# Patient Record
Sex: Female | Born: 1960 | Race: White | Hispanic: Yes | Marital: Single | Smoking: Former smoker
Health system: Southern US, Community
[De-identification: ages and names within clinical notes are randomized; demographics above are authoritative.]

## PROBLEM LIST (undated history)

## (undated) DIAGNOSIS — J3089 Other allergic rhinitis: Secondary | ICD-10-CM

## (undated) DIAGNOSIS — R51 Headache: Secondary | ICD-10-CM

## (undated) DIAGNOSIS — R519 Headache, unspecified: Secondary | ICD-10-CM

## (undated) DIAGNOSIS — E7800 Pure hypercholesterolemia, unspecified: Secondary | ICD-10-CM

## (undated) DIAGNOSIS — R42 Dizziness and giddiness: Secondary | ICD-10-CM

## (undated) DIAGNOSIS — Z9109 Other allergy status, other than to drugs and biological substances: Secondary | ICD-10-CM

## (undated) HISTORY — DX: Headache: R51

## (undated) HISTORY — DX: Other allergic rhinitis: J30.89

## (undated) HISTORY — DX: Headache, unspecified: R51.9

## (undated) HISTORY — DX: Pure hypercholesterolemia, unspecified: E78.00

## (undated) HISTORY — PX: TUMOR REMOVAL: SHX12

## (undated) HISTORY — PX: VAGINAL HYSTERECTOMY: SHX2639

## (undated) HISTORY — DX: Other allergy status, other than to drugs and biological substances: Z91.09

## (undated) HISTORY — DX: Dizziness and giddiness: R42

---

## 2007-05-21 ENCOUNTER — Ambulatory Visit (HOSPITAL_COMMUNITY): Admission: RE | Admit: 2007-05-21 | Discharge: 2007-05-21 | Payer: Self-pay | Admitting: Family Medicine

## 2010-09-18 ENCOUNTER — Encounter: Payer: Self-pay | Admitting: Family Medicine

## 2010-09-19 ENCOUNTER — Encounter: Payer: Self-pay | Admitting: Family Medicine

## 2013-08-19 DIAGNOSIS — R52 Pain, unspecified: Secondary | ICD-10-CM | POA: Diagnosis not present

## 2013-08-19 DIAGNOSIS — R0989 Other specified symptoms and signs involving the circulatory and respiratory systems: Secondary | ICD-10-CM | POA: Diagnosis not present

## 2013-08-19 DIAGNOSIS — J069 Acute upper respiratory infection, unspecified: Secondary | ICD-10-CM | POA: Diagnosis not present

## 2013-12-02 ENCOUNTER — Other Ambulatory Visit (HOSPITAL_COMMUNITY): Payer: Self-pay | Admitting: General Surgery

## 2013-12-02 DIAGNOSIS — E7800 Pure hypercholesterolemia, unspecified: Secondary | ICD-10-CM | POA: Diagnosis not present

## 2013-12-02 DIAGNOSIS — Z1231 Encounter for screening mammogram for malignant neoplasm of breast: Secondary | ICD-10-CM

## 2013-12-02 DIAGNOSIS — M2550 Pain in unspecified joint: Secondary | ICD-10-CM | POA: Diagnosis not present

## 2013-12-03 DIAGNOSIS — M2550 Pain in unspecified joint: Secondary | ICD-10-CM | POA: Diagnosis not present

## 2013-12-03 DIAGNOSIS — E7800 Pure hypercholesterolemia, unspecified: Secondary | ICD-10-CM | POA: Diagnosis not present

## 2013-12-05 ENCOUNTER — Ambulatory Visit (HOSPITAL_COMMUNITY)
Admission: RE | Admit: 2013-12-05 | Discharge: 2013-12-05 | Disposition: A | Discharge status: Home / Self Care | Payer: Medicare Other | Source: Ambulatory Visit | Attending: General Surgery | Admitting: General Surgery

## 2013-12-05 DIAGNOSIS — Z1231 Encounter for screening mammogram for malignant neoplasm of breast: Secondary | ICD-10-CM | POA: Diagnosis not present

## 2013-12-15 DIAGNOSIS — E7800 Pure hypercholesterolemia, unspecified: Secondary | ICD-10-CM | POA: Diagnosis not present

## 2013-12-15 DIAGNOSIS — I10 Essential (primary) hypertension: Secondary | ICD-10-CM | POA: Diagnosis not present

## 2014-01-14 ENCOUNTER — Other Ambulatory Visit (HOSPITAL_COMMUNITY): Payer: Self-pay | Admitting: General Surgery

## 2014-01-14 DIAGNOSIS — R109 Unspecified abdominal pain: Secondary | ICD-10-CM | POA: Diagnosis not present

## 2014-01-14 DIAGNOSIS — R1011 Right upper quadrant pain: Secondary | ICD-10-CM | POA: Diagnosis not present

## 2014-01-14 DIAGNOSIS — I10 Essential (primary) hypertension: Secondary | ICD-10-CM | POA: Diagnosis not present

## 2014-01-14 DIAGNOSIS — R110 Nausea: Secondary | ICD-10-CM

## 2014-01-15 ENCOUNTER — Ambulatory Visit (HOSPITAL_COMMUNITY)
Admission: RE | Admit: 2014-01-15 | Discharge: 2014-01-15 | Disposition: A | Discharge status: Home / Self Care | Payer: Medicare Other | Source: Ambulatory Visit | Attending: General Surgery | Admitting: General Surgery

## 2014-01-15 DIAGNOSIS — R1011 Right upper quadrant pain: Secondary | ICD-10-CM

## 2014-01-15 DIAGNOSIS — R110 Nausea: Secondary | ICD-10-CM

## 2014-01-15 DIAGNOSIS — I10 Essential (primary) hypertension: Secondary | ICD-10-CM | POA: Diagnosis not present

## 2014-03-17 DIAGNOSIS — F0789 Other personality and behavioral disorders due to known physiological condition: Secondary | ICD-10-CM | POA: Diagnosis not present

## 2014-03-17 DIAGNOSIS — R1011 Right upper quadrant pain: Secondary | ICD-10-CM | POA: Diagnosis not present

## 2014-03-17 DIAGNOSIS — R109 Unspecified abdominal pain: Secondary | ICD-10-CM | POA: Diagnosis not present

## 2014-03-17 DIAGNOSIS — I10 Essential (primary) hypertension: Secondary | ICD-10-CM | POA: Diagnosis not present

## 2014-04-03 DIAGNOSIS — I10 Essential (primary) hypertension: Secondary | ICD-10-CM | POA: Diagnosis not present

## 2014-04-03 DIAGNOSIS — R7309 Other abnormal glucose: Secondary | ICD-10-CM | POA: Diagnosis not present

## 2014-04-03 DIAGNOSIS — R413 Other amnesia: Secondary | ICD-10-CM | POA: Diagnosis not present

## 2014-04-03 DIAGNOSIS — E7800 Pure hypercholesterolemia, unspecified: Secondary | ICD-10-CM | POA: Diagnosis not present

## 2014-06-16 DIAGNOSIS — I10 Essential (primary) hypertension: Secondary | ICD-10-CM | POA: Diagnosis not present

## 2014-06-16 DIAGNOSIS — K219 Gastro-esophageal reflux disease without esophagitis: Secondary | ICD-10-CM | POA: Diagnosis not present

## 2014-09-02 DIAGNOSIS — I10 Essential (primary) hypertension: Secondary | ICD-10-CM | POA: Diagnosis not present

## 2014-09-02 DIAGNOSIS — K2970 Gastritis, unspecified, without bleeding: Secondary | ICD-10-CM | POA: Diagnosis not present

## 2015-08-16 DIAGNOSIS — Z0000 Encounter for general adult medical examination without abnormal findings: Secondary | ICD-10-CM | POA: Diagnosis not present

## 2015-09-05 ENCOUNTER — Telehealth: Payer: Self-pay | Admitting: *Deleted

## 2015-09-05 NOTE — Telephone Encounter (Signed)
Called and spoke to [name] through interpreter line. Gave number [id number]. He called pt.  LVM for pt. Advised office closed tomorrow d/t inclement weather and we will have to r/s appt tomorrow. Advised to call on Tuesday to r/s appt.

## 2015-09-06 ENCOUNTER — Ambulatory Visit: Payer: Medicare Other | Admitting: Neurology

## 2015-09-13 DIAGNOSIS — G4719 Other hypersomnia: Secondary | ICD-10-CM | POA: Diagnosis not present

## 2015-09-13 DIAGNOSIS — G4733 Obstructive sleep apnea (adult) (pediatric): Secondary | ICD-10-CM | POA: Diagnosis not present

## 2015-09-27 DIAGNOSIS — G4719 Other hypersomnia: Secondary | ICD-10-CM | POA: Diagnosis not present

## 2015-09-27 DIAGNOSIS — G4733 Obstructive sleep apnea (adult) (pediatric): Secondary | ICD-10-CM | POA: Diagnosis not present

## 2015-10-11 DIAGNOSIS — R411 Anterograde amnesia: Secondary | ICD-10-CM | POA: Diagnosis not present

## 2015-10-11 DIAGNOSIS — I10 Essential (primary) hypertension: Secondary | ICD-10-CM | POA: Diagnosis not present

## 2015-10-11 DIAGNOSIS — E7800 Pure hypercholesterolemia, unspecified: Secondary | ICD-10-CM | POA: Diagnosis not present

## 2015-10-24 ENCOUNTER — Telehealth: Payer: Self-pay | Admitting: Neurology

## 2015-10-24 NOTE — Telephone Encounter (Signed)
Jamie Obrien, patient has appointment Monday. Can you ensure an interpreter will be with patient? If not we will need to call and reschedule. Very difficult to use the interpreter line when evaluatinf for dementia or memory loss. thanks

## 2015-10-25 ENCOUNTER — Ambulatory Visit (INDEPENDENT_AMBULATORY_CARE_PROVIDER_SITE_OTHER): Payer: Medicare Other | Admitting: Neurology

## 2015-10-25 ENCOUNTER — Encounter: Payer: Self-pay | Admitting: Neurology

## 2015-10-25 VITALS — BP 114/70 | HR 78 | Ht 61.0 in | Wt 159.6 lb

## 2015-10-25 DIAGNOSIS — R413 Other amnesia: Secondary | ICD-10-CM | POA: Diagnosis not present

## 2015-10-25 DIAGNOSIS — R519 Headache, unspecified: Secondary | ICD-10-CM | POA: Insufficient documentation

## 2015-10-25 DIAGNOSIS — M8500 Fibrous dysplasia (monostotic), unspecified site: Secondary | ICD-10-CM | POA: Insufficient documentation

## 2015-10-25 DIAGNOSIS — F329 Major depressive disorder, single episode, unspecified: Secondary | ICD-10-CM | POA: Insufficient documentation

## 2015-10-25 DIAGNOSIS — F32A Depression, unspecified: Secondary | ICD-10-CM | POA: Insufficient documentation

## 2015-10-25 DIAGNOSIS — R51 Headache: Secondary | ICD-10-CM | POA: Diagnosis not present

## 2015-10-25 DIAGNOSIS — E785 Hyperlipidemia, unspecified: Secondary | ICD-10-CM

## 2015-10-25 DIAGNOSIS — F411 Generalized anxiety disorder: Secondary | ICD-10-CM | POA: Diagnosis not present

## 2015-10-25 DIAGNOSIS — E538 Deficiency of other specified B group vitamins: Secondary | ICD-10-CM

## 2015-10-25 DIAGNOSIS — D496 Neoplasm of unspecified behavior of brain: Secondary | ICD-10-CM | POA: Diagnosis not present

## 2015-10-25 NOTE — Telephone Encounter (Signed)
Called and spoke to [place] in interpreter service/cone. She is going to have Almyra Free come to appt to help interpret. Phone for Almyra Free: [phone].

## 2015-10-25 NOTE — Telephone Encounter (Signed)
LVM for interpreter linue. Advised I wanted to make sure interpreter was coming w/ pt today. Pt is NP and needs interpreter. Gave MRN number. Gave [facility] phone number for them to call back. Please skype me if they call back.

## 2015-10-25 NOTE — Patient Instructions (Signed)
Overall you are doing fairly well but I do want to suggest a few things today:   Remember to drink plenty of fluid, eat healthy meals and do not skip any meals. Try to eat protein with a every meal and eat a healthy snack such as fruit or nuts in between meals. Try to keep a regular sleep-wake schedule and try to exercise daily, particularly in the form of walking, 20-30 minutes a day, if you can.   As far as diagnostic testing: MRi brain and labs  I would like to see you back in 4 months, sooner if we need to. Please call us with any interim questions, concerns, problems, updates or refill requests.   Our phone number is [phone]. We also have an after hours call service for urgent matters and there is a physician on-call for urgent questions. For any emergencies you know to call 911 or go to the nearest emergency room

## 2015-10-25 NOTE — Progress Notes (Signed)
[facility]    Provider:  Dr Jaynee Eagles Referring Provider: Lavella Lemons, PA Primary Care Physician:  No PCP Per Patient  CC:  Memory problems  HPI:  Jamie Obrien is a 55 y.o. female here as a referral from Dr. Luciana Axe for memory problems. PMHx depression, anxiety, HLD.  Son provides information. Interpreter here as well. They started in 2012. 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 The surgery was in [place]. She does not have any information such as what kind of tumor. She was eaving headaches all her life and they discovered the tumor in 2004 and it was removed in 2006 and then again in 2011. She has pain around the right eye and right side which has been going on for years.She feels the memory changes started after surgery. Not progressive. For example, she forgot where she parked her car in [place] and she started crying but she calmed down and eventually found it. She can't even remember what her date of birth is sometimes and remembering names. She loses things. Not getting worse. Memory problems are not getting worse. She has anxiety. She has depression and cries a lot. Son says she does have depression. She feels like she has been depressed since the surgery. It has been a tough since the surgery and then no offer for follow up or psychology and that was never addressed.  She has decreased energy. She has fatigue. She snores excessively and she wakes up and snoring or feeling she can't breath does wake her often. She has morning headaches. Feels like she never rests well. She recently had a sleep study and awaiting results. No history of Alzheimer's or dementia  in the family.  Reviewed notes, labs and imaging from outside physicians, which showed: HgbA1c 5.7, CMP, CBC unremarkable. TSH nml.   Review of Systems: Patient complains of symptoms per HPI as well as the following symptoms: blurred vision, eye pain, easy bruising, cough,  feeling cold, memory loss, headache, numbness, insomnia, anxiety, decreased energy, disinterest in activities, =. Pertinent negatives per HPI. All others negative.   Social History   Social History  . Marital Status: Single    Spouse Name: N/A  . Number of Children: 6  . Years of Education: 6   Occupational History  . Not on file.   Social History Main Topics  . Smoking status: Former Research scientist (life sciences)  . Smokeless tobacco: Not on file  . Alcohol Use: No  . Drug Use: No  . Sexual Activity: Not on file   Other Topics Concern  . Not on file   Social History Narrative   Lives in apartments with helper.    Caffeine use: soda: rare   Tea: 1 per day    Family History  Problem Relation Age of Onset  . Dementia Neg Hx     Past Medical History  Diagnosis Date  . High cholesterol   . Headache     Past Surgical History  Procedure Laterality Date  . Tumor removal Right   . Vaginal hysterectomy      No current outpatient prescriptions on file.   No current facility-administered medications for this visit.    Allergies as of 10/25/2015  . (No Known Allergies)    Vitals: BP 114/70 mmHg  Pulse 78  Ht 5\' 1"  (1.549 m)  Wt 159 lb 9.6 oz (72.394 kg)  BMI 30.17 kg/m2 Last Weight:  Wt Readings from Last 1 Encounters:  10/25/15 159 lb 9.6 oz (72.394 kg)   Last Height:   Ht Readings from Last 1 Encounters:  10/25/15 5\' 1"  (1.549 m)   Physical exam: Exam: Gen: NAD, conversant                 CV: RRR, no MRG. No Carotid Bruits. No peripheral edema, warm, nontender Eyes: Conjunctivae clear without exudates or hemorrhage  Neuro: Detailed Neurologic Exam  Speech:    Speech is normal; fluent and spontaneous with normal comprehension.  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 The left pupil is round, and reactive to light. The left fundus is normal and spontaneous venous pulsations are present. Left eye visual fields are full to finger confrontation. Extraocular movements are intact with,mild right impaired abduction. Trigeminal sensation is intact and the muscles of mastication are normal. Right ptosis. . The palate elevates in the midline. Hearing intact. Voice is normal. Shoulder shrug is normal. The tongue has normal motion without fasciculations.   Coordination:    Normal finger to nose and heel to shin.  Gait:    Not ataxic.  Motor Observation:    No asymmetry, no atrophy, and no involuntary movements noted. Tone:    Normal muscle tone.    Posture:    Posture is normal.    Strength:    Strength is V/V in the upper and lower limbs.      Sensation: intact to LT     Reflex Exam:  DTR's:    Deep tendon reflexes in the upper and lower extremities are normal bilaterally.   Toes:    The toes are downgoing bilaterally.   Clonus:    Clonus is absent.       Assessment/Plan:  55 year old female here for evaluation of memory changes. MMSE 25/30.  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 May be sequelae from intracranial surgery secondary to unknown type of brain lesion, will order MRI of the brain w/wo contrast and labs today.   - Depression and anxiety may be a factor. Recommend that [place] discuss and screen for depression and  anxiety at her next appointment without her son present.  If depression, recommend treatment. - Follow up 4 months with Ward Givens. Neuropsychological testing would be helpful but may be difficult with the language barrier. Will follow labs and MRI of the brain first.  CC: Edwin Dada, MD  [facility] [address] [place], [place] [address]  Phone [phone] Fax [phone]

## 2015-10-26 ENCOUNTER — Telehealth: Payer: Self-pay | Admitting: *Deleted

## 2015-10-26 NOTE — Telephone Encounter (Signed)
-----   Message from Melvenia Beam, MD sent at 10/26/2015  2:00 PM EST ----- Labs normal except slightly elevated alkaline phosphatase and slightly elevated liver enzyme. She should follow up with her primary care in the next 3 months nothing urgent unless she has any new symptoms such as abdominal pain. Please forward to pcp thanks

## 2015-10-26 NOTE — Telephone Encounter (Signed)
Called interpreter line. Spoke to [place] [id number] ID number. Tried both home numbers listed under pt name/Pablo. Pt's went to VM but unable to leave VM. [place] number kept ringing and unable to LVM. Will try again later/send letter

## 2015-10-27 LAB — COMPREHENSIVE METABOLIC PANEL
A/G RATIO: 1.9 (ref 1.1–2.5)
ALT: 42 IU/L — AB (ref 0–32)
AST: 29 IU/L (ref 0–40)
Albumin: 4.6 g/dL (ref 3.5–5.5)
Alkaline Phosphatase: 152 IU/L — ABNORMAL HIGH (ref 39–117)
BILIRUBIN TOTAL: 0.3 mg/dL (ref 0.0–1.2)
BUN/Creatinine Ratio: 23 (ref 9–23)
BUN: 15 mg/dL (ref 6–24)
CO2: 21 mmol/L (ref 18–29)
Calcium: 9.9 mg/dL (ref 8.7–10.2)
Chloride: 101 mmol/L (ref 96–106)
Creatinine, Ser: 0.64 mg/dL (ref 0.57–1.00)
GFR calc Af Amer: 117 mL/min/{1.73_m2} (ref >59)
GFR calc non Af Amer: 101 mL/min/{1.73_m2} (ref >59)
GLOBULIN, TOTAL: 2.4 g/dL (ref 1.5–4.5)
Glucose: 91 mg/dL (ref 65–99)
Potassium: 4.5 mmol/L (ref 3.5–5.2)
Sodium: 142 mmol/L (ref 134–144)
Total Protein: 7 g/dL (ref 6.0–8.5)

## 2015-10-27 LAB — RPR: RPR: NONREACTIVE

## 2015-10-27 LAB — B12 AND FOLATE PANEL: Folate: 11.8 ng/mL (ref >3.0)

## 2015-10-27 LAB — HIV ANTIBODY (ROUTINE TESTING W REFLEX): HIV SCREEN 4TH GENERATION: NONREACTIVE

## 2015-10-27 LAB — METHYLMALONIC ACID, SERUM: Methylmalonic Acid: 108 nmol/L (ref 0–378)

## 2015-10-28 NOTE — Telephone Encounter (Signed)
Called again to discuss results, no answer, VM not set up.

## 2015-10-28 NOTE — Telephone Encounter (Signed)
-----   Message from Melvenia Beam, MD sent at 10/27/2015  5:11 PM EST ----- Labs normal except some mildly elevated Alkphos and ALT, liver enzymes. Follow up with primary care for this.

## 2015-11-01 ENCOUNTER — Encounter: Payer: Self-pay | Admitting: *Deleted

## 2015-11-01 NOTE — Telephone Encounter (Signed)
Called interpreter line and spoke to [place] for [place] interpreter. She tried calling number listed for pt but she states it sounds like person picks up and hangs up. I had her try again, but it went to VM and VM not set up to leave message. Had her try [phone] (number listed for son, Rosaria Ferries). No answer. Will send letter since unable to reach pt x3 by phone.

## 2015-12-20 DIAGNOSIS — H8113 Benign paroxysmal vertigo, bilateral: Secondary | ICD-10-CM | POA: Diagnosis not present

## 2015-12-20 DIAGNOSIS — R748 Abnormal levels of other serum enzymes: Secondary | ICD-10-CM | POA: Diagnosis not present

## 2015-12-21 DIAGNOSIS — Z8669 Personal history of other diseases of the nervous system and sense organs: Secondary | ICD-10-CM | POA: Diagnosis not present

## 2015-12-21 DIAGNOSIS — Z9889 Other specified postprocedural states: Secondary | ICD-10-CM | POA: Diagnosis not present

## 2015-12-21 DIAGNOSIS — R42 Dizziness and giddiness: Secondary | ICD-10-CM | POA: Diagnosis not present

## 2015-12-21 DIAGNOSIS — G43919 Migraine, unspecified, intractable, without status migrainosus: Secondary | ICD-10-CM | POA: Diagnosis not present

## 2015-12-21 DIAGNOSIS — I10 Essential (primary) hypertension: Secondary | ICD-10-CM | POA: Diagnosis not present

## 2015-12-21 DIAGNOSIS — Z87891 Personal history of nicotine dependence: Secondary | ICD-10-CM | POA: Diagnosis not present

## 2015-12-21 DIAGNOSIS — R51 Headache: Secondary | ICD-10-CM | POA: Diagnosis not present

## 2016-01-10 DIAGNOSIS — R079 Chest pain, unspecified: Secondary | ICD-10-CM | POA: Diagnosis not present

## 2016-01-10 DIAGNOSIS — I10 Essential (primary) hypertension: Secondary | ICD-10-CM | POA: Diagnosis not present

## 2016-01-10 DIAGNOSIS — R200 Anesthesia of skin: Secondary | ICD-10-CM | POA: Diagnosis not present

## 2016-01-10 DIAGNOSIS — R202 Paresthesia of skin: Secondary | ICD-10-CM | POA: Diagnosis not present

## 2016-01-10 DIAGNOSIS — M542 Cervicalgia: Secondary | ICD-10-CM | POA: Diagnosis not present

## 2016-01-10 DIAGNOSIS — R0789 Other chest pain: Secondary | ICD-10-CM | POA: Diagnosis not present

## 2016-01-21 ENCOUNTER — Encounter: Payer: Self-pay | Admitting: Family Medicine

## 2016-01-21 ENCOUNTER — Ambulatory Visit (HOSPITAL_COMMUNITY)
Admission: RE | Admit: 2016-01-21 | Discharge: 2016-01-21 | Disposition: A | Discharge status: Home / Self Care | Payer: Medicare Other | Source: Ambulatory Visit | Attending: Family Medicine | Admitting: Family Medicine

## 2016-01-21 ENCOUNTER — Ambulatory Visit (INDEPENDENT_AMBULATORY_CARE_PROVIDER_SITE_OTHER): Payer: Medicare Other | Admitting: Family Medicine

## 2016-01-21 VITALS — BP 124/78 | HR 72 | Temp 97.5°F | Ht 61.0 in | Wt 155.8 lb

## 2016-01-21 DIAGNOSIS — Z9889 Other specified postprocedural states: Secondary | ICD-10-CM | POA: Insufficient documentation

## 2016-01-21 DIAGNOSIS — R51 Headache: Secondary | ICD-10-CM

## 2016-01-21 DIAGNOSIS — R200 Anesthesia of skin: Secondary | ICD-10-CM

## 2016-01-21 DIAGNOSIS — G9389 Other specified disorders of brain: Secondary | ICD-10-CM | POA: Insufficient documentation

## 2016-01-21 DIAGNOSIS — R519 Headache, unspecified: Secondary | ICD-10-CM

## 2016-01-21 DIAGNOSIS — R930 Abnormal findings on diagnostic imaging of skull and head, not elsewhere classified: Secondary | ICD-10-CM | POA: Diagnosis not present

## 2016-01-21 NOTE — Progress Notes (Signed)
BP 124/78 mmHg  Pulse 72  Temp(Src) 97.5 F (36.4 C) (Oral)  Ht 5\' 1"  (1.549 m)  Wt 155 lb 12.8 oz (70.67 kg)  BMI 29.45 kg/m2   Subjective:    Patient ID: Jamie Obrien, female    DOB: July 19, 1961, 55 y.o.   MRN: [id number]  HPI: Jamie Obrien is a 55 y.o. female presenting on 01/21/2016 for New Patient (Initial Visit); Nausea; Headache; Numbness; and Chest Pain   HPI Left-sided numbness and headaches Patient has been having left-sided numbness and increased headaches that are focused around her right side and her right eye. She had a benign tumor removed from that side behind the eye in 2004 and then had 2 follow-up surgeries for cysts removed in 2006 and 2011. This headache and left-sided numbness all started 2-3 days ago. She does get headaches sometimes but this is the most severe they've been and also the first time that they've been associated with this left-sided numbness. She has been having difficulties walking because of the left-sided numbness and she feels like her whole left sinus tingling and burning in his sleep. She is usually very active and moves around and does a lot of stuff. She went to the emergency department at [facility] and had imaging of both the CT scan of head and CT neck. She has not had an MRI of this. She has seen a neurologist in February that was the first time she saw him and it looks like the neurologist wanted to order an MRI but that I don't see any results from that. She does have a follow-up 0.0 with the neurologist next month. She denies any fevers or chills. 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 She denies any numbness or weakness in the face but mostly just her left arm and left leg. She denies any major trauma or anything recently that happened to her.  Relevant past medical, surgical, family and social history reviewed and updated as indicated. Interim medical  history since our last visit reviewed. Allergies and medications reviewed and updated.  Review of Systems  Constitutional: Positive for fatigue. Negative for fever and chills.  HENT: Negative for congestion, ear discharge and ear pain.   Eyes: Negative for redness and visual disturbance.  Respiratory: Negative for chest tightness and shortness of breath.   Cardiovascular: Negative for chest pain and leg swelling.  Genitourinary: Negative for dysuria and difficulty urinating.  Musculoskeletal: Negative for back pain and gait problem.  Skin: Negative for rash.  Neurological: Positive for dizziness, weakness, numbness and headaches. Negative for tremors, seizures, speech difficulty and light-headedness.  Psychiatric/Behavioral: Negative for behavioral problems and agitation.  All other systems reviewed and are negative.   Per HPI unless specifically indicated above  Social History   Social History  . Marital Status: Single    Spouse Name: N/A  . Number of Children: 6  . Years of Education: 6   Occupational History  . Not on file.   Social History Main Topics  . Smoking status: Former Smoker -- 0.25 packs/day for 1 years    Quit date: 10/18/2015  . Smokeless tobacco: Never Used  . Alcohol Use: No  . Drug Use: No  . Sexual Activity: Not Currently   Other Topics Concern  . Not on file   Social History Narrative   Lives in apartments with helper.    Caffeine use: soda: rare  Tea: 1 per day    Past Surgical History  Procedure Laterality Date  . Tumor removal Right   . Vaginal hysterectomy      Family History  Problem Relation Age of Onset  . Dementia Neg Hx   . Hypertension Maternal Grandmother       Medication List    Notice  As of 01/21/2016  3:52 PM   You have not been prescribed any medications.         Objective:    BP 124/78 mmHg  Pulse 72  Temp(Src) 97.5 F (36.4 C) (Oral)  Ht 5\' 1"  (1.549 m)  Wt 155 lb 12.8 oz (70.67 kg)  BMI 29.45 kg/m2    Wt Readings from Last 3 Encounters:  01/21/16 155 lb 12.8 oz (70.67 kg)  10/25/15 159 lb 9.6 oz (72.394 kg)    Physical Exam  Constitutional: She is oriented to person, place, and time. She appears well-developed and well-nourished. No distress.  Eyes: Conjunctivae and EOM are normal. Pupils are equal, round, and reactive to light.  Cardiovascular: Normal rate, regular rhythm, normal heart sounds and intact distal pulses.   No murmur heard. Pulmonary/Chest: Effort normal and breath sounds normal. No respiratory distress. She has no wheezes.  Musculoskeletal: Normal range of motion. She exhibits no edema or tenderness.  Neurological: She is alert and oriented to person, place, and time. She has normal reflexes. She displays no atrophy, no tremor and normal reflexes. A sensory deficit (Patient has perceptible numbness on the left arm and left leg) is present. No cranial nerve deficit (Cranial nerves intact except for the right eye where she has blindness and so it doesn't have the same motions or reactivity as the other eye.). She exhibits normal muscle tone. She displays no seizure activity. Gait (She has a slow limping gait because of the weakness in the left side of her body) abnormal. Coordination normal.  Skin: Skin is warm and dry. No rash noted. She is not diaphoretic.  Psychiatric: She has a normal mood and affect. Her behavior is normal.  Nursing note and vitals reviewed.   Results for orders placed or performed in visit on 10/25/15  B12 and Folate Panel  Result Value Ref Range   Vitamin B-12 >2000 (H) 211 - 946 pg/mL   Folate 11.8 >3.0 ng/mL  Methylmalonic acid, serum  Result Value Ref Range   Methylmalonic Acid 108 0 - 378 nmol/L  RPR  Result Value Ref Range   RPR Ser Ql Non Reactive Non Reactive  HIV antibody  Result Value Ref Range   HIV Screen 4th Generation wRfx Non Reactive Non Reactive  Comprehensive metabolic panel  Result Value Ref Range   Glucose 91 65 - 99 mg/dL    BUN 15 6 - 24 mg/dL   Creatinine, Ser 0.64 0.57 - 1.00 mg/dL   GFR calc non Af Amer 101 >59 mL/min/1.73   GFR calc Af Amer 117 >59 mL/min/1.73   BUN/Creatinine Ratio 23 9 - 23   Sodium 142 134 - 144 mmol/L   Potassium 4.5 3.5 - 5.2 mmol/L   Chloride 101 96 - 106 mmol/L   CO2 21 18 - 29 mmol/L   Calcium 9.9 8.7 - 10.2 mg/dL   Total Protein 7.0 6.0 - 8.5 g/dL   Albumin 4.6 3.5 - 5.5 g/dL   Globulin, Total 2.4 1.5 - 4.5 g/dL   Albumin/Globulin Ratio 1.9 1.1 - 2.5   Bilirubin Total 0.3 0.0 - 1.2 mg/dL   Alkaline Phosphatase  152 (H) 39 - 117 IU/L   AST 29 0 - 40 IU/L   ALT 42 (H) 0 - 32 IU/L      Assessment & Plan:   Problem List Items Addressed This Visit    None    Visit Diagnoses    Left sided numbness    -  Primary    Relevant Orders    MR Brain Wo Contrast    Frequent headaches        right sided headaches, recommended to go back to neurology       Information from [facility] was faxed over to [place] and reviewed prior to sending her off for an MRI.  Follow up plan: Return in about 2 weeks (around 02/04/2016), or if symptoms worsen or fail to improve, for f/u headaches and numbness.  Caryl Pina, MD [facility] 01/21/2016, 3:52 PM

## 2016-01-25 ENCOUNTER — Telehealth: Payer: Self-pay | Admitting: Adult Health

## 2016-01-25 NOTE — Telephone Encounter (Signed)
Daughter Dola Argyle called from [place], regarding [place] appointment, would like to move up sooner, advised Suzie that she is not listed on DPR, asked if brother Rosaria Ferries was available or patient, neither are available at this time, will have brother call back.

## 2016-01-31 ENCOUNTER — Ambulatory Visit (INDEPENDENT_AMBULATORY_CARE_PROVIDER_SITE_OTHER): Payer: Medicare Other | Admitting: Adult Health

## 2016-01-31 ENCOUNTER — Encounter: Payer: Self-pay | Admitting: Adult Health

## 2016-01-31 VITALS — BP 138/76 | HR 73 | Ht 61.0 in | Wt 152.5 lb

## 2016-01-31 DIAGNOSIS — R930 Abnormal findings on diagnostic imaging of skull and head, not elsewhere classified: Secondary | ICD-10-CM

## 2016-01-31 DIAGNOSIS — R2689 Other abnormalities of gait and mobility: Secondary | ICD-10-CM | POA: Diagnosis not present

## 2016-01-31 DIAGNOSIS — F4323 Adjustment disorder with mixed anxiety and depressed mood: Secondary | ICD-10-CM | POA: Diagnosis not present

## 2016-01-31 DIAGNOSIS — R208 Other disturbances of skin sensation: Secondary | ICD-10-CM | POA: Diagnosis not present

## 2016-01-31 DIAGNOSIS — R6889 Other general symptoms and signs: Secondary | ICD-10-CM

## 2016-01-31 DIAGNOSIS — R200 Anesthesia of skin: Secondary | ICD-10-CM

## 2016-01-31 NOTE — Progress Notes (Addendum)
PATIENT: Jamie Obrien DOB: 17-Aug-1961  REASON FOR VISIT: follow up- memory HISTORY FROM: patient-The patient requires an interpreter however one was not sent with her today. She agreed for [place] an employee at [facility] to translate for her. She signed the waiver  HISTORY OF PRESENT ILLNESS:  Today 01/31/16: Ms. Orie Jamie Obrien is a 55 year old female with a history of memory disturbance. She returns today for evaluation. 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 At the last visit with Dr. Jaynee Eagles she ordered an MRI of the brain the patient had this done last month.she is also been to the [facility] emergency room twice- for chest pain, headache and paresthesia. She reports that her memory has remained the same. She operates a Teacher, music. Denies getting lost going to familiar places. She states that she is able to complete all ADLs independently. However she does feel that it is a "struggle at times." She states that she has a hard time grasping objects. She reports that this started in April. She also developed tremors. She states that on April 24 she had dizziness and was treated for vertigo. Also since the last visit she has been treated for chest pain. She states that the chest pain has continued and radiates to the back. She was seen at [facility] for this discomfort. Several tests was completed however the results are not available to me. The patient also reports stiffness in the arms and across the neck. She states that the left leg feels as if it can give out at times. She also complains of burning and tingling in the hands and feet. She denies being diabetic.she also reports left facial numbness. She also saw her primary care recently for these same symptoms. He reordered an MRI that she completed in May.  MRI of the brain without contrast 01/21/2016: Postsurgical changes of the greater wing of the right sphenoid, lateral wall of the right  orbit and right temporal bone. 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 Currently, bone expansion of the greater wing of the right sphenoid and lateral wall of right orbit contributes to inward displacement of the right lateral rectus muscle and narrowing of the right orbital apex. This may compromise the right optic nerve. Patient would benefit from dedicated orbital CT in addition to obtaining any prior exams for comparison. 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 No acute infarct. Opacification right sphenoid sinus.  CT head without contrast 12/21/2015: status post right frontal craniotomy. 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 No intracranial abnormality seen.  CT head and CT cervical spine without contrast 01/10/2016: no acute findings of head CT. Stable evidence of prior right-sided craniotomy and skull base fibrous dysplasia. Normal CT of the cervical spine    HPI [facility]): Jamie Obrien is a 55 y.o. female here as a referral from Dr. Luciana Axe for memory problems. PMHx depression, anxiety, HLD. Son provides information. Interpreter here as well. They started in 2012. 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 The surgery was in [place]. She does not have any information such as what kind of tumor. She was eaving headaches  all her life and they discovered the tumor in 2004 and it was removed in 2006 and then again in 2011. She has pain around the right eye and right side which has been going on for years.She feels the memory changes started after surgery. Not progressive. For example, she forgot where she parked her car in [place] and she started  crying but she calmed down and eventually found it. She can't even remember what her date of birth is sometimes and remembering names. She loses things. Not getting worse. Memory problems are not getting worse. She has anxiety. She has depression and cries a lot. Son says she does have depression. She feels like she has been depressed since the surgery. It has been a tough since the surgery and then no offer for follow up or psychology and that was never addressed. She has decreased energy. She has fatigue. She snores excessively and she wakes up and snoring or feeling she can't breath does wake her often. She has morning headaches. Feels like she never rests well. She recently had a sleep study and awaiting results. No history of Alzheimer's or dementia in the family.  Reviewed notes, labs and imaging from outside physicians, which showed: HgbA1c 5.7, CMP, CBC unremarkable. TSH nml.   REVIEW OF SYSTEMS: Out of a complete 14 system review of symptoms, the patient complains only of the following symptoms, and all other reviewed systems are negative.    ALLERGIES: No Known Allergies  HOME MEDICATIONS: No outpatient prescriptions prior to visit.   No facility-administered medications prior to visit.    PAST MEDICAL HISTORY: Past Medical History  Diagnosis Date  . High cholesterol   . Headache     PAST SURGICAL HISTORY: Past Surgical History  Procedure Laterality Date  . Tumor removal Right   . Vaginal hysterectomy      FAMILY HISTORY: Family History  Problem Relation Age of Onset  . Dementia Neg Hx   . Hypertension Maternal Grandmother     SOCIAL HISTORY: Social History   Social History  . Marital Status: Single    Spouse Name: N/A  . Number of Children: 6  . Years of Education: 6   Occupational History  . Not on file.   Social History Main Topics  . Smoking status: Former Smoker -- 0.25 packs/day for 1 years    Quit date: 10/18/2015  . Smokeless tobacco: Never  Used  . Alcohol Use: No  . Drug Use: No  . Sexual Activity: Not Currently   Other Topics Concern  . Not on file   Social History Narrative   Lives in apartments with helper.    Caffeine use: soda: rare   Tea: 1 per day      PHYSICAL EXAM  Filed Vitals:   01/31/16 0851  BP: 138/76  Pulse: 73  Height: 5\' 1"  (1.549 m)  Weight: 152 lb 8 oz (69.174 kg)   Body mass index is 28.83 kg/(m^2). MMSE - Mini Mental State Exam 10/25/2015  Orientation to time 4  Orientation to Place 3  Registration 3  Attention/ Calculation 5  Recall 3  Language- name 2 objects 2  Language- repeat 0  Language- follow 3 step command 3  Language- read & follow direction 1  Write a sentence 1  Copy design 0  Total score 25    Generalized: Well developed, in no acute distress   Neurological examination  Mentation: Alert oriented to time, place, history taking. Follows all commands speech and language  fluent. MMSE 26/30 Cranial nerve II-XII: blind in right eye. Left pupil is  round and reactive to light.. Extraocular movements were full, visual field were full on confrontational test. Patient initially reports decreased sensation on the left side of the face. However when I repeated this later in the exam she reported that there was numbness on the right side of the face. Uvula tongue midline. Head turning and shoulder shrug  were normal and symmetric. Motor: The motor testing reveals 5 over 5 strength of all 4 extremities.good grips. Good symmetric motor tone is noted throughout.  Sensory: Sensory testing is intact to soft touch on all 4 extremities.initially she reports numbness in the right upper extremity. When I reassessed his later in the exam she reports numbness in the left upper extremity. No evidence of extinction is noted.  Coordination: Cerebellar testing reveals good finger-nose-finger and heel-to-shin bilaterally. During the exam the patient has a whole-body tremor.However when she  Is no  longer on the exam table being examined the tremor does not exist any longer. Gait and station: functional gait abnormality. Initially the patient seemed to have a spastic gait however with distraction her gait normalized. She does have a slight limp on the left. She is able to do heel and toe walking without difficulty. Reflexes: Deep tendon reflexes are symmetric and normal bilaterally.   DIAGNOSTIC DATA (LABS, IMAGING, TESTING) - I reviewed patient records, labs, notes, testing and imaging myself where available.      Component Value Date/Time   NA 142 10/25/2015 1354   K 4.5 10/25/2015 1354   CL 101 10/25/2015 1354   CO2 21 10/25/2015 1354   GLUCOSE 91 10/25/2015 1354   BUN 15 10/25/2015 1354   CREATININE 0.64 10/25/2015 1354   CALCIUM 9.9 10/25/2015 1354   PROT 7.0 10/25/2015 1354   ALBUMIN 4.6 10/25/2015 1354   AST 29 10/25/2015 1354   ALT 42* 10/25/2015 1354   ALKPHOS 152* 10/25/2015 1354   BILITOT 0.3 10/25/2015 1354   GFRNONAA 101 10/25/2015 1354   GFRAA 117 10/25/2015 1354      ASSESSMENT AND PLAN 55 y.o. year old female  has a past medical history of High cholesterol and Headache. here with:  1. Memory disturbance 2. Functional gait disorder 3. Multiple somatic complaints 4. Anxiety  The patient's memory has remained stable. Her MMSE is consistent with her previous score. The patient has multiple somatic complaints. Her physical exam revealed some inconsistencies. The most recent MRI suggested follow-up with CT of the orbits and MRI with contrast. I will order those test today. The patient will be sent to physical therapy for functional gait abnormality. On exam the patient's gait normalized with distraction. It seems that fear of walking may play a factor in her gait abnormality? Also recommended that the patient follow up with her primary care for anxiety and depression. This has been reported at previous visits and the patient confirms that she does have some  anxiety. Patient advised that if her symptoms worsen or she develops any new symptoms she should let us know. She will follow-up in 3 months with Dr. Jaynee Eagles.  I spent approximately 1 hour with the patient. 50% of this time was spent reviewing the patient's symptoms. Dr. Jaynee Eagles also came in and examined the patient.  Ward Givens, MSN, NP-C 01/31/2016, 8:52 AM [facility] [address], [place], [place] [address] [phone]    Personally participated in and made any corrections needed to history, physical, neuro  exam,assessment and plan as stated above, evaluated lab date, reviewed imaging studies and agree with radiology interpretations.    Sarina Ill, MD [facility]

## 2016-01-31 NOTE — Patient Instructions (Addendum)
Resonancia magnetica del cerebro con contraste Tomografia computarizada (CT) de las orbitas Un orden de terapia fisica fue enviada  Por favor vea al doctor de cabezera por ansieded/depresion

## 2016-02-01 NOTE — Telephone Encounter (Signed)
Jinny Blossom fyi thanks

## 2016-02-01 NOTE — Telephone Encounter (Signed)
Dr Ahern- FYI 

## 2016-02-01 NOTE — Telephone Encounter (Signed)
Spoke with someone from [place] who was clarifying the order for the MRI. She states that they have tried to call this patient 8 times and left messages with no luck of scheduling her. The last attempt was on the 30th of May. They will try again but if the patient happens to call back into our office please have her call [place].

## 2016-02-05 DIAGNOSIS — D496 Neoplasm of unspecified behavior of brain: Secondary | ICD-10-CM | POA: Diagnosis not present

## 2016-02-05 DIAGNOSIS — R5383 Other fatigue: Secondary | ICD-10-CM | POA: Diagnosis not present

## 2016-02-05 DIAGNOSIS — I10 Essential (primary) hypertension: Secondary | ICD-10-CM | POA: Diagnosis not present

## 2016-02-05 DIAGNOSIS — E7800 Pure hypercholesterolemia, unspecified: Secondary | ICD-10-CM | POA: Diagnosis not present

## 2016-02-05 DIAGNOSIS — R411 Anterograde amnesia: Secondary | ICD-10-CM | POA: Diagnosis not present

## 2016-02-05 DIAGNOSIS — R748 Abnormal levels of other serum enzymes: Secondary | ICD-10-CM | POA: Diagnosis not present

## 2016-02-05 DIAGNOSIS — H8113 Benign paroxysmal vertigo, bilateral: Secondary | ICD-10-CM | POA: Diagnosis not present

## 2016-02-05 DIAGNOSIS — F329 Major depressive disorder, single episode, unspecified: Secondary | ICD-10-CM | POA: Diagnosis not present

## 2016-02-05 DIAGNOSIS — R2689 Other abnormalities of gait and mobility: Secondary | ICD-10-CM | POA: Diagnosis not present

## 2016-02-05 DIAGNOSIS — R413 Other amnesia: Secondary | ICD-10-CM | POA: Diagnosis not present

## 2016-02-21 ENCOUNTER — Ambulatory Visit: Payer: Medicare Other | Admitting: Adult Health

## 2016-02-21 DIAGNOSIS — R411 Anterograde amnesia: Secondary | ICD-10-CM | POA: Diagnosis not present

## 2016-02-21 DIAGNOSIS — F329 Major depressive disorder, single episode, unspecified: Secondary | ICD-10-CM | POA: Diagnosis not present

## 2016-02-21 DIAGNOSIS — R2689 Other abnormalities of gait and mobility: Secondary | ICD-10-CM | POA: Diagnosis not present

## 2016-02-21 DIAGNOSIS — I10 Essential (primary) hypertension: Secondary | ICD-10-CM | POA: Diagnosis not present

## 2016-02-21 DIAGNOSIS — R5383 Other fatigue: Secondary | ICD-10-CM | POA: Diagnosis not present

## 2016-02-21 DIAGNOSIS — R748 Abnormal levels of other serum enzymes: Secondary | ICD-10-CM | POA: Diagnosis not present

## 2016-03-16 ENCOUNTER — Telehealth: Payer: Self-pay | Admitting: Family Medicine

## 2016-03-16 NOTE — Telephone Encounter (Signed)
denied °

## 2016-04-24 ENCOUNTER — Ambulatory Visit (HOSPITAL_COMMUNITY): Payer: Medicare Other | Attending: Adult Health | Admitting: Physical Therapy

## 2016-04-24 ENCOUNTER — Telehealth (HOSPITAL_COMMUNITY): Payer: Self-pay | Admitting: Physical Therapy

## 2016-04-24 DIAGNOSIS — R29898 Other symptoms and signs involving the musculoskeletal system: Secondary | ICD-10-CM | POA: Insufficient documentation

## 2016-04-24 DIAGNOSIS — R262 Difficulty in walking, not elsewhere classified: Secondary | ICD-10-CM | POA: Diagnosis not present

## 2016-04-24 DIAGNOSIS — R2681 Unsteadiness on feet: Secondary | ICD-10-CM | POA: Diagnosis not present

## 2016-04-24 DIAGNOSIS — M6281 Muscle weakness (generalized): Secondary | ICD-10-CM | POA: Insufficient documentation

## 2016-04-24 DIAGNOSIS — Z9181 History of falling: Secondary | ICD-10-CM | POA: Insufficient documentation

## 2016-04-24 NOTE — Patient Instructions (Signed)
   TOES RAISES - DORSIFLEXION - BOTH  Start with your feet on the ground.  Next, raise up both forefeet and toes as shown as you bend at your ankle.  Keep your heels on the ground the entire time.  Repeat 5-10 times, twice a day.    Seated Hip Abduction with TB  Begin in a seated position with good posture and  both feet on the floor.  Tie an exercise band snugly around the knees, with both knees touching in the middle. Slowly push your knees outward into the band. Hold, then return to the center.  Repeat 5-10 times, twice a day.

## 2016-04-24 NOTE — Telephone Encounter (Signed)
Filled out and placed referral forms for skilled OT and speech evaluate/treats in outgoing fax box.    Deniece Ree PT, DPT [phone]

## 2016-04-24 NOTE — Therapy (Signed)
[facility] [address] [place], [place], [address] Phone: [phone]   Fax:  [phone]  Physical Therapy Evaluation  Patient Details  Name: Jamie Obrien MRN: [id number] Date of Birth: 06/23/1961 Referring Provider: Ward Givens   Encounter Date: 04/24/2016      PT End of Session - 04/24/16 1625    Visit Number 1   Number of Visits 16   Date for PT Re-Evaluation 05/22/16   Authorization Type Medicare/Medicaid    Authorization Time Period 04/24/16 to 06/24/16   Authorization - Visit Number 1   Authorization - Number of Visits 10   PT Start Time [id number]   PT Stop Time 1520   PT Time Calculation (min) 49 min   Equipment Utilized During Treatment Gait belt   Activity Tolerance Patient tolerated treatment well   Behavior During Therapy [facility] for tasks assessed/performed      Past Medical History:  Diagnosis Date  . Headache   . High cholesterol     Past Surgical History:  Procedure Laterality Date  . TUMOR REMOVAL Right   . VAGINAL HYSTERECTOMY      There were no vitals filed for this visit.       Subjective Assessment - 04/24/16 1439    Subjective Patient had a brain tumor surgery in 2003 (less extensive surgery), however symptoms returned in 2010/2011. Patient states that she woke up dizzy and HA and nausea; when was walking she almost fell, all of this kept getting worse. Her eye got swollen and they finally did images and found the tumor. She had surgery in 2011 in [place] for the tumor and has never received PT. The hardest thing for her to do right now is basically everything, she has trouble standing, gripping with her R UE; it is mostly her R side that is weaker. She does report altered sensation. She does not have stairs to deal with at home. She becomes quite unsteady with mobility in general but has not had any falls. She cannot see out of the right eye.     Pertinent History brain tumor with surgery in 2011,  HA, memory loss, anxiety, vision loss R eye    How long can you stand comfortably? less than 5 minutes with walker    How long can you walk comfortably? less than 5 minutes with walker    Patient Stated Goals increase independence, walk better, be more steady, get stronger    Currently in Pain? Yes   Pain Score 7    Pain Location Back   Pain Orientation Mid   Pain Descriptors / Indicators Aching;Discomfort   Pain Type Chronic pain   Pain Radiating Towards both legs, cold sensation    Pain Onset More than a month ago   Pain Frequency Intermittent   Aggravating Factors  sitting down    Pain Relieving Factors standing up (a little)   Effect of Pain on Daily Activities limits mobility             [facility] PT Assessment - 04/24/16 0001      Assessment   Medical Diagnosis functional gait abnormality    Referring Provider Ward Givens    Onset Date/Surgical Date --  surgery in 2011   Next MD Visit MD in september      [facility]   Has the patient fallen in the past 6 months Yes   How many times? 3   Has the patient had a decrease in activity  level because of a fear of falling?  Yes   Is the patient reluctant to leave their home because of a fear of falling?  Yes     Prior Function   Level of Independence Needs assistance with ADLs;Needs assistance with homemaking;Needs assistance with gait;Needs assistance with transfers   Vocation On disability     Observation/Other Assessments   Focus on Therapeutic Outcomes (FOTO)  88% limited      Strength   Right Hip Flexion 2/5   Right Hip Extension 2/5   Right Hip ABduction 2/5   Left Hip Flexion 4/5   Left Hip Extension 2+/5   Left Hip ABduction 3/5   Right Knee Flexion 3/5   Right Knee Extension 3/5   Left Knee Flexion 4-/5   Left Knee Extension 5/5   Right Ankle Dorsiflexion 3+/5   Left Ankle Dorsiflexion 5/5     Ambulation/Gait   Gait Comments typical of chronic neurological impairment: reduced gait speed, flexed at  hips, reduced ankle DF, increaesd knee flexion, ataxic, fatigue noted      High Level Balance   High Level Balance Comments static stance WLF BOS no UEs x 2 minutes; 35 seconds narrow BOS, no UEs                            PT Education - 04/24/16 1623    Education provided Yes   Education Details prognosis, HEP, POC    Person(s) Educated Patient   Methods Explanation   Comprehension Verbalized understanding;Returned demonstration;Need further instruction          PT Short Term Goals - 04/24/16 1632      PT SHORT TERM GOAL #1   Title Patient to be able to maintain static stance with no UEs for 5 minutes without fatigue in order to show improved balance and functional activity tolerance    Time 4   Period Weeks   Status New     PT SHORT TERM GOAL #2   Title Patient to be able to tolerate 5-10 of low-moderate intensity activity with minimal fatigue in order to assist in reducing fall risk and to improve functional task performance    Time 4   Period Weeks   Status New     PT SHORT TERM GOAL #3   Title Patient to be able to ambulate at least 5-10 minutes of continuous gait with LRAD and minimal fatigue in order to improve general mobility and function    Time 4   Period Weeks   Status New     PT SHORT TERM GOAL #4   Title Patient to be able to maintain tandem stance on solid surface for at least 20 seconds with no UE assist in order to show improved balance skills and reduced fall risk    Time 4   Period Weeks   Status New     PT SHORT TERM GOAL #5   Title Patient to be able to consistently and correctly perform appropriate HEP, to be updated PRN    Time 4   Period Weeks   Status New           PT Long Term Goals - 04/24/16 1636      PT LONG TERM GOAL #1   Title Patient to demonstrate strength 4/5 in all tested muscle groups in order to improve general mobility and functional task tolerance    Time 8   Period Weeks  Status New     PT LONG  TERM GOAL #2   Title Patient to be able to ambulate at least 15-20 minutes with minimal fatigue and LRAD in order to show improved mobility and improved general function   Time 8   Period Weeks   Status New     PT LONG TERM GOAL #3   Title Patient to be able to maintain tandem stance for at least 45 seconds and SLS for at least 15 seconds with no UE support on a solid surface in order to show improved balance and reduced fall risk    Time 8   Period Weeks   Status New     PT LONG TERM GOAL #4   Title Patient to be able to tolerate at least 10-15 minutes of moderate intensity activity in order to show improved functional task tolerance and to increase tolerance/safety with extended activities    Time 8   Period Weeks   Status New     PT LONG TERM GOAL #5   Title Patient to be able to verbally explain 5 ways to prevent falls at home and in community, and will report no LOB or falls within the past 4 weeks in order to show improved safety/reduced fall risk    Time 8   Period Weeks   Status New               Plan - 04/24/16 1626    Clinical Impression Statement Interpreter [facility]) present throughout session. Patient arrives presenting with chronic symptoms related to long-term functional impairments related to  brain tumor; she reports that she was first diagnosed with the tumor in the early 2000s and had a small surgery- however, she had recurrence of symptoms and ended up having surgery again in 2011. She cannot see out of her R eye and has a lot of difficulty with mobility and ADLs, reporting that she loses her balance quite a bit and also needs quite a bit of help throughout her day. Upon examination, patient reveals severe gait impairment, reduced functional activity tolerance, significant functional muscle weakness, severe balance deficits, and as a whole does present as quite the fall risk at this time. Noted sensory impairments via patient report as well as impaired RAM  motions with testing today. Patient will benefit from skilled PT services in order to address functional limitations and assist in reaching optimal level of function with minimal fall risk. Also recommend potential referral to skilled OT and Speech Therapy services.    Rehab Potential Good   Clinical Impairments Affecting Rehab Potential chronicity of impairments    PT Frequency 2x / week   PT Duration 8 weeks   PT Treatment/Interventions ADLs/Self Care Home Management;Biofeedback;Cryotherapy;Moist Heat;DME Instruction;Gait training;Stair training;Functional mobility training;Therapeutic activities;Therapeutic exercise;Balance training;Neuromuscular re-education;Patient/family education;Manual techniques;Energy conservation;Taping   PT Next Visit Plan review HEP and goals; CAUTION WITH EXPANDING HEP AS PATIENT LIVES ALONE AND HAS LIMITED ASSISTANCE. Focus on proximal strength and functional activity tolerance, balance, gait training. Trial Nustep. Consider trial of bodyweight support.    PT Home Exercise Plan 8/28: seated ankle dorsiflexion, seated hip ABD with red TB    Recommended Other Services skilled OT and Speech therapy services    Consulted and Agree with Plan of Care Patient      Patient will benefit from skilled therapeutic intervention in order to improve the following deficits and impairments:  Abnormal gait, Improper body mechanics, Impaired sensation, Decreased mobility, Decreased coordination, Postural dysfunction, Decreased activity  tolerance, Decreased endurance, Decreased strength, Decreased balance, Decreased safety awareness, Difficulty walking, Impaired vision/preception  Visit Diagnosis: Unsteadiness on feet - Plan: PT plan of care cert/re-cert  Muscle weakness (generalized) - Plan: PT plan of care cert/re-cert  Difficulty in walking, not elsewhere classified - Plan: PT plan of care cert/re-cert  Other symptoms and signs involving the musculoskeletal system - Plan: PT plan  of care cert/re-cert  History of falling - Plan: PT plan of care cert/re-cert      G-Codes - [id number]    Functional Assessment Tool Used Based on skilled clinical assessment of gait, balance, strength, functional mobility, functional activity tolerance    Functional Limitation Mobility: Walking and moving around   Mobility: Walking and Moving Around Current Status [id number]) At least 80 percent but less than 100 percent impaired, limited or restricted   Mobility: Walking and Moving Around Goal Status [phone]) At least 60 percent but less than 80 percent impaired, limited or restricted       Problem List Patient Active Problem List   Diagnosis Date Noted  . Brain tumor ([place]) 10/25/2015  . Chronic daily headache 10/25/2015  . Morning headache 10/25/2015  . Depression 10/25/2015  . Generalized anxiety disorder 10/25/2015  . Memory loss of unknown cause 10/25/2015  . HLD (hyperlipidemia) 10/25/2015    Deniece Ree PT, DPT [facility] [address] [place], [place], [address] Phone: [phone]   Fax:  [phone]  Name: MONET CONDRA MRN: [id number] Date of Birth: 03-24-1961

## 2016-04-26 ENCOUNTER — Telehealth (HOSPITAL_COMMUNITY): Payer: Self-pay

## 2016-04-26 ENCOUNTER — Ambulatory Visit (HOSPITAL_COMMUNITY): Payer: Medicare Other

## 2016-04-26 NOTE — Telephone Encounter (Signed)
No show, called and left message informing missed apt, included next apt date and time with contact info given.    [address], [place]; [place] [phone]

## 2016-05-02 ENCOUNTER — Ambulatory Visit (HOSPITAL_COMMUNITY): Payer: Medicare Other | Attending: Adult Health

## 2016-05-02 DIAGNOSIS — R29898 Other symptoms and signs involving the musculoskeletal system: Secondary | ICD-10-CM

## 2016-05-02 DIAGNOSIS — Z9181 History of falling: Secondary | ICD-10-CM

## 2016-05-02 DIAGNOSIS — R2681 Unsteadiness on feet: Secondary | ICD-10-CM

## 2016-05-02 DIAGNOSIS — M6281 Muscle weakness (generalized): Secondary | ICD-10-CM | POA: Diagnosis not present

## 2016-05-02 DIAGNOSIS — R262 Difficulty in walking, not elsewhere classified: Secondary | ICD-10-CM

## 2016-05-02 NOTE — Therapy (Addendum)
[place], [place], [address] Phone: [phone]   Fax:  [phone]  Physical Therapy Treatment/Discharge  Patient Details  Name: Jamie Obrien MRN: [id number] Date of Birth: 01/09/1961 Referring Provider: Ward Givens   Encounter Date: 05/02/2016      PT End of Session - 05/02/16 1749    Visit Number 2   Number of Visits 16   Date for PT Re-Evaluation 05/22/16   Authorization Type Medicare/Medicaid    Authorization Time Period 04/24/16 to 06/24/16   Authorization - Visit Number 2   Authorization - Number of Visits 10   PT Start Time 1651   PT Stop Time 1738   PT Time Calculation (min) 47 min   Equipment Utilized During Treatment Gait belt   Activity Tolerance Patient tolerated treatment well   Behavior During Therapy [facility] for tasks assessed/performed      Past Medical History:  Diagnosis Date  . Headache   . High cholesterol     Past Surgical History:  Procedure Laterality Date  . TUMOR REMOVAL Right   . VAGINAL HYSTERECTOMY      There were no vitals filed for this visit.      Subjective Assessment - 05/02/16 1700    Subjective Pt arrived ambulating with Standard walker and reports of lower back pain scale 7/10.  Reports complaince with HEP daily without difficulty.  Pt does reports she has had one fall since initial eval getting out of bed heading towards the restroom, able to get herself off the floor required 5-10 minutes, no reports of pain with fall;   Pertinent History brain tumor with surgery in 2011, HA, memory loss, anxiety, vision loss R eye    Patient Stated Goals increase independence, walk better, be more steady, get stronger    Currently in Pain? Yes   Pain Score 7    Pain Location Back   Pain Orientation Lower;Right   Pain Descriptors / Indicators Pressure   Pain Type Chronic pain   Pain Radiating Towards both legs, cold sensation   Pain Onset More than a month ago   Pain Frequency Intermittent   Aggravating Factors  sitting down   Pain Relieving Factors standing up (a little)   Effect of Pain on Daily Activities limits mobilty                         [facility] Adult PT Treatment/Exercise - 05/02/16 0001      Ambulation/Gait   Ambulation Distance (Feet) 120 Feet  2x 60   Assistive device Standard walker   Gait Pattern Trunk flexed;Ataxic;Decreased trunk rotation;Decreased dorsiflexion - right;Decreased stride length;Right flexed knee in stance   Gait Comments typical of chronic neurological impairment: reduced gait speed, flexed at hips, reduced ankle DF, increaesd knee flexion, ataxic, fatigue noted      Exercises   Exercises Knee/Hip     Knee/Hip Exercises: Seated   Other Seated Knee/Hip Exercises dorsi/plantar flexion 2x 10   Abduction/Adduction  Strengthening;Both;2 sets;10 reps   Abd/Adduction Limitations RTB     Knee/Hip Exercises: Supine   Heel Slides 2 sets;10 reps   Hip Adduction Isometric 2 sets;10 reps   Hip Adduction Isometric Limitations ABD 2x 10   Bridges 2 sets;10 reps                  PT Short Term Goals - 04/24/16 1632      PT SHORT TERM GOAL #1  Title Patient to be able to maintain static stance with no UEs for 5 minutes without fatigue in order to show improved balance and functional activity tolerance    Time 4   Period Weeks   Status New     PT SHORT TERM GOAL #2   Title Patient to be able to tolerate 5-10 of low-moderate intensity activity with minimal fatigue in order to assist in reducing fall risk and to improve functional task performance    Time 4   Period Weeks   Status New     PT SHORT TERM GOAL #3   Title Patient to be able to ambulate at least 5-10 minutes of continuous gait with LRAD and minimal fatigue in order to improve general mobility and function    Time 4   Period Weeks   Status New     PT SHORT TERM GOAL #4   Title Patient to be able to maintain tandem stance on  solid surface for at least 20 seconds with no UE assist in order to show improved balance skills and reduced fall risk    Time 4   Period Weeks   Status New     PT SHORT TERM GOAL #5   Title Patient to be able to consistently and correctly perform appropriate HEP, to be updated PRN    Time 4   Period Weeks   Status New           PT Long Term Goals - 04/24/16 1636      PT LONG TERM GOAL #1   Title Patient to demonstrate strength 4/5 in all tested muscle groups in order to improve general mobility and functional task tolerance    Time 8   Period Weeks   Status New     PT LONG TERM GOAL #2   Title Patient to be able to ambulate at least 15-20 minutes with minimal fatigue and LRAD in order to show improved mobility and improved general function   Time 8   Period Weeks   Status New     PT LONG TERM GOAL #3   Title Patient to be able to maintain tandem stance for at least 45 seconds and SLS for at least 15 seconds with no UE support on a solid surface in order to show improved balance and reduced fall risk    Time 8   Period Weeks   Status New     PT LONG TERM GOAL #4   Title Patient to be able to tolerate at least 10-15 minutes of moderate intensity activity in order to show improved functional task tolerance and to increase tolerance/safety with extended activities    Time 8   Period Weeks   Status New     PT LONG TERM GOAL #5   Title Patient to be able to verbally explain 5 ways to prevent falls at home and in community, and will report no LOB or falls within the past 4 weeks in order to show improved safety/reduced fall risk    Time 8   Period Weeks   Status New               Plan - 05/02/16 1750    Clinical Impression Statement Reviewed compliance and assured correct technique with HEP, reviewed goals and copy of evaluation given to pt.  Interpreter (Marta Col) present through session.  Session focus on safe awareness following report of fall last week and  proximal musculature strengthenig in [place] this  session.  Pt able to complete therex with therapist facilitation for proper form and technqiue with noted fatigue especially with Rt LE.  EOS no reports of pain, pt was limited by fatigue.  Gait training assistance was complete to car for safety as pt was fatigued.     Rehab Potential Good   Clinical Impairments Affecting Rehab Potential chronicity of impairments    PT Frequency 2x / week   PT Duration 8 weeks   PT Treatment/Interventions ADLs/Self Care Home Management;Biofeedback;Cryotherapy;Moist Heat;DME Instruction;Gait training;Stair training;Functional mobility training;Therapeutic activities;Therapeutic exercise;Balance training;Neuromuscular re-education;Patient/family education;Manual techniques;Energy conservation;Taping   PT Next Visit Plan Next session educated/reinforce 5 safety precautions for fall prevention.  CAUTION WITH EXPANDING HEP AS PATIENT LIVES ALONE AND HAS LIMITED ASSISTANCE. Focus on proximal strength and functional activity tolerance, balance, gait training. Trial Nustep. Consider trial of bodyweight support.    PT Home Exercise Plan 8/28: seated ankle dorsiflexion, seated hip ABD with red TB       Patient will benefit from skilled therapeutic intervention in order to improve the following deficits and impairments:  Abnormal gait, Improper body mechanics, Impaired sensation, Decreased mobility, Decreased coordination, Postural dysfunction, Decreased activity tolerance, Decreased endurance, Decreased strength, Decreased balance, Decreased safety awareness, Difficulty walking, Impaired vision/preception  Visit Diagnosis: Unsteadiness on feet  Muscle weakness (generalized)  Difficulty in walking, not elsewhere classified  Other symptoms and signs involving the musculoskeletal system  History of falling     Problem List Patient Active Problem List   Diagnosis Date Noted  . Brain tumor ([place]) 10/25/2015  . Chronic  daily headache 10/25/2015  . Morning headache 10/25/2015  . Depression 10/25/2015  . Generalized anxiety disorder 10/25/2015  . Memory loss of unknown cause 10/25/2015  . HLD (hyperlipidemia) 10/25/2015   Ihor Austin, [place]; [place]  Aldona Lento 05/02/2016, 6:04 PM  [place], [place], [address] Phone: [phone]   Fax:  [phone]  Name: Jamie Obrien MRN: [id number] Date of Birth: 28-Jan-1961   PHYSICAL THERAPY DISCHARGE SUMMARY  Visits from Start of Care: 2  Current functional level related to goals / functional outcomes: Pt currently requesting discharge from [facility] so that she can receive home health PT to address her deficits noted above. Her daughter contacted [place] requesting the discharge due to increased difficulty getting to/from the clinic because of her number of limitations in strength/balance/mobility. She would benefit from skilled PT in the home to address limitations and allow her to possibly return to [facility] once she has improved some.    Remaining deficits: Pt with only 1 visit since eval, no significant improvements noted per treatment note above.   Education / Equipment: None provided Plan: Patient agrees to discharge.  Patient goals were not met. Patient is being discharged due to the patient's request.  ?????      6:33 PM,05/05/16 Elly Modena PT, [facility] [phone]

## 2016-05-03 DIAGNOSIS — F329 Major depressive disorder, single episode, unspecified: Secondary | ICD-10-CM | POA: Diagnosis not present

## 2016-05-03 DIAGNOSIS — R413 Other amnesia: Secondary | ICD-10-CM | POA: Diagnosis not present

## 2016-05-03 DIAGNOSIS — R2689 Other abnormalities of gait and mobility: Secondary | ICD-10-CM | POA: Diagnosis not present

## 2016-05-03 DIAGNOSIS — R748 Abnormal levels of other serum enzymes: Secondary | ICD-10-CM | POA: Diagnosis not present

## 2016-05-03 DIAGNOSIS — I10 Essential (primary) hypertension: Secondary | ICD-10-CM | POA: Diagnosis not present

## 2016-05-03 DIAGNOSIS — R5383 Other fatigue: Secondary | ICD-10-CM | POA: Diagnosis not present

## 2016-05-03 DIAGNOSIS — D496 Neoplasm of unspecified behavior of brain: Secondary | ICD-10-CM | POA: Diagnosis not present

## 2016-05-03 DIAGNOSIS — R410 Disorientation, unspecified: Secondary | ICD-10-CM | POA: Diagnosis not present

## 2016-05-05 ENCOUNTER — Telehealth (HOSPITAL_COMMUNITY): Payer: Self-pay | Admitting: Physical Therapy

## 2016-05-05 ENCOUNTER — Ambulatory Visit (HOSPITAL_COMMUNITY): Payer: Medicare Other | Admitting: Physical Therapy

## 2016-05-05 NOTE — Telephone Encounter (Signed)
Pt's daughter Adonis Huguenin requested that all future appointment be cx at our office. They will be getting HomeHealth with [facility] this weekend.  Called Santiago Glad at [facility] and let her know we are D/C patient today> NF 05/06/16

## 2016-05-05 NOTE — Telephone Encounter (Signed)
Pt's daughter Adonis Huguenin requested that all future appointment be cx at our office. They will be getting HomeHealth with [facility] this weekend.  Called Santiago Glad at [facility] and let her know we are D/C patient today> NF 05/06/16

## 2016-05-08 ENCOUNTER — Ambulatory Visit (INDEPENDENT_AMBULATORY_CARE_PROVIDER_SITE_OTHER): Payer: Medicare Other | Admitting: Neurology

## 2016-05-08 ENCOUNTER — Encounter: Payer: Self-pay | Admitting: Neurology

## 2016-05-08 ENCOUNTER — Ambulatory Visit (HOSPITAL_COMMUNITY): Payer: Medicare Other | Admitting: Physical Therapy

## 2016-05-08 VITALS — BP 130/80 | HR 69 | Ht 61.0 in | Wt 146.0 lb

## 2016-05-08 DIAGNOSIS — M549 Dorsalgia, unspecified: Secondary | ICD-10-CM

## 2016-05-08 DIAGNOSIS — R51 Headache: Secondary | ICD-10-CM

## 2016-05-08 DIAGNOSIS — R200 Anesthesia of skin: Secondary | ICD-10-CM | POA: Diagnosis not present

## 2016-05-08 DIAGNOSIS — R2990 Unspecified symptoms and signs involving the nervous system: Secondary | ICD-10-CM

## 2016-05-08 DIAGNOSIS — G8190 Hemiplegia, unspecified affecting unspecified side: Secondary | ICD-10-CM | POA: Diagnosis not present

## 2016-05-08 DIAGNOSIS — M4802 Spinal stenosis, cervical region: Secondary | ICD-10-CM | POA: Diagnosis not present

## 2016-05-08 DIAGNOSIS — G939 Disorder of brain, unspecified: Secondary | ICD-10-CM

## 2016-05-08 DIAGNOSIS — G8929 Other chronic pain: Secondary | ICD-10-CM

## 2016-05-08 DIAGNOSIS — M542 Cervicalgia: Secondary | ICD-10-CM

## 2016-05-08 DIAGNOSIS — D496 Neoplasm of unspecified behavior of brain: Secondary | ICD-10-CM | POA: Diagnosis not present

## 2016-05-08 DIAGNOSIS — R519 Headache, unspecified: Secondary | ICD-10-CM

## 2016-05-08 MED ORDER — DULOXETINE HCL 30 MG PO CPEP: 30.0000 mg | ORAL_CAPSULE | Freq: Every day | ORAL | 12 refills | Status: DC

## 2016-05-08 NOTE — Progress Notes (Signed)
Jamie Obrien    Provider:  Dr Jamie Obrien Referring Provider: Dettinger, Jamie Obrien, Jamie Obrien Primary Care Physician:  No primary care provider on file.  CC:  Memory problems  Interval history from Jamie Obrien 05/08/2016:   Interval history 05/08/2016: She never had the MRI of the brain w/wo contrast or the CT of the orbits completed, multiple messages were left, son says he received the messages but had too much going on in his life. Walking is not better. She can hardly walk. The walking is still difficult but not worsening. She is here with an interpreter. She has been getting physical therapy. Everything feels numb, her arms, she has a lot of neck pain especially if she turns her head. Son says he had too much on his mind and he never got back to [place] for the imaging, he doesn't really know why. She can't walk. All of a sudden she is not moving, walking is worse, she is using a walker, son is trying to get her a nurse. Will orde rimaging of the brain and neck again and bring son and patient to our scheduler today to get it all set today. She reports weakness, headache, the right arm just drops. The right leg drops as well. The left side is easier to move. Lower abdomen feels numb. She has back pain. She cries in the office today. She is having urinary incontinence. She has headaches. She has only had a headache 2x since being here last. Pounding, on the right side and radiates to the back. She has a lot of back pain and neck pain and numbness and tingling.   MRI of the brain 01/21/2016: IMPRESSION: Postsurgical changes of the greater wing of the right sphenoid, lateral wall of the right orbit and right temporal bone. 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 Currently, bony expansion of the greater wing of the right sphenoid and lateral wall of right orbit contributes to inward displacement of the right lateral rectus muscle and narrowing of the right orbital apex. This may  compromise the right optic nerve. Patient would benefit from dedicated orbital CT in addition to obtaining any prior exams for comparison.  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  No acute infarct.  Opacification right sphenoid sinus.    Interval history 01/31/16 [facility]): [name] is a 55 year old female with a history of memory disturbance. She returns today for evaluation. 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 At the last visit with Dr. Jaynee Obrien she ordered an MRI of the brain the patient had this done last month.she is also been to the [facility] emergency room twice- for chest pain, headache and paresthesia. She reports that her memory has remained the same. She operates a Teacher, music. Denies getting lost going to familiar places. She states that she is able to complete all ADLs independently. However she does feel that it is a "struggle at times." She states that she has a hard time grasping objects. She reports that this started in April. She also developed tremors. She states that on April 24 she had dizziness and was treated for vertigo. Also since the last visit she has been treated for chest pain.  She states that the chest pain has continued and radiates to the back. She was seen at [facility] for this discomfort. Several tests was completed however the results are not available to me. The patient also reports stiffness in the arms and across the neck. She states that the left leg feels as if it can give out at times. She also complains of burning and tingling in the hands and feet. She denies being diabetic.she also reports left facial numbness. She also saw her primary care  recently for these same symptoms. He reordered an MRI that she completed in May.  HPI 10/25/2015:  Jamie Obrien is a 55 y.o. female here as a referral from Dr. Luciana Obrien for memory problems. PMHx depression, anxiety, HLD.  Son provides information. Interpreter here as well. They started in 2012. 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 The surgery was in [place]. She does not have any information such as what kind of tumor. She was eaving headaches all her life and they discovered the tumor in 2004 and it was removed in 2006 and then again in 2011. She has pain around the right eye and right side which has been going on for years.She feels the memory changes started after surgery. Not progressive. For example, she forgot where she parked her car in [place] and she started crying but she calmed down and eventually found it. She can't even remember what her date of birth is sometimes and remembering names. She loses things. Not getting worse. Memory problems are not getting worse. She has anxiety. She has depression and cries a lot. Son says she does have depression. She feels like she has been depressed since the surgery. It has been a tough since the surgery and then no offer for follow up or psychology and that was never addressed.  She has decreased energy. She has fatigue. She snores excessively and she wakes up and snoring or feeling she can't breath does wake her often. She has morning headaches. Feels like she never rests well. She recently had a sleep study and awaiting results. No history of Alzheimer's or dementia  in the family.  Reviewed notes, labs and imaging from outside physicians, which showed: HgbA1c 5.7, CMP, CBC unremarkable. TSH nml.   IMPRESSION: Postsurgical changes of the greater wing of the right sphenoid, lateral wall of the right orbit and right temporal bone. 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 Currently, bony expansion  of the greater wing of the right sphenoid and lateral wall of right orbit contributes to inward displacement of the right lateral rectus muscle and narrowing of the right orbital apex. This may compromise the right optic nerve. Patient would benefit from dedicated orbital CT in addition to obtaining any prior exams for comparison.  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  No acute infarct.  Opacification right sphenoid sinus.  Review of Systems: Patient complains of symptoms per HPI as well as the following symptoms: blurred vision, eye pain, easy bruising, cough, feeling cold, memory loss, headache, numbness, insomnia, anxiety, decreased energy, disinterest in activities, =. Pertinent negatives per HPI. All others negative.   Social History   Social History  . Marital status: Single    Spouse  name: N/A  . Number of children: 6  . Years of education: 6   Occupational History  . Not on file.   Social History Main Topics  . Smoking status: Former Smoker    Packs/day: 0.25    Years: 1.00    Quit date: 10/18/2015  . Smokeless tobacco: Never Used  . Alcohol use No  . Drug use: No  . Sexual activity: Not Currently   Other Topics Concern  . Not on file   Social History Narrative   Lives in apartments with helper.    Caffeine use: soda: rare   Tea: 1 per day    Family History  Problem Relation Age of Onset  . Hypertension Maternal Grandmother   . Dementia Neg Hx     Past Medical History:  Diagnosis Date  . Headache   . High cholesterol     Past Surgical History:  Procedure Laterality Date  . TUMOR REMOVAL Right   . VAGINAL HYSTERECTOMY      Current Outpatient Prescriptions  Medication Sig Dispense Refill  .  DULoxetine (CYMBALTA) 30 MG capsule Take 1 capsule (30 mg total) by mouth daily. 30 capsule 12   No current facility-administered medications for this visit.     Allergies as of 05/08/2016  . 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 The left pupil is round, and reactive to light.Left eye visual fields are full to finger confrontation. Extraocular movements are intact with,mild right impaired abduction. Trigeminal sensation is impaired on the left and the muscles of mastication are normal(confirmed left). Right ptosis. . The palate elevates in the midline. Hearing intact. Voice is normal. Shoulder shrug is normal. The tongue has normal motion without fasciculations.   Coordination:  slowed but intact with upper extremities and left leg. Cannot perform right leg due to weakness.   Gait:: Attempted    Tone:    Increased tone right , difficult for her to relax.  Posture:        Strength: right arm 4+/5 biceps and triceps, right hip flexion 2/5, right leg flexion 3/5, right DF 3/5 otherwise intact         Sensation: right hemisensory loss     Reflex Exam:  DTR's:    Deep tendon reflexes in the upper and lower extremities are brisk bilaterally.   Toes:    The toes are upgoing bilaterally.   Clonus:    Clonus on the right AJ 7 beats   Gait: in wheelchair       Assessment/Plan:  55 year old female here for evaluation of memory changes, increased difficulty walking, multiple somatic and other neurologic complaints. She also has depression and Anxiety, untreated. Some inconsistent complaints and clinical findings.   -  Brain tumor, history of resection unknown type and worsened gait disorder. 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 MRI of the brain w/wo and CT of the orbits were ordered but patient never had them completed. Reordered and had them scheduled with  outrstaff today. 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 Patient reports headaches not increased, only 2 in the last 3 months - Depression and Anxiety, untreated: Needs follow up with primary care. Will try Cymbalta may also help with the other multiple somatic and neurologic complaints, pains, sensory disturbances an dheadaches.  - Gait disorder: MRI brain and cervical cord. And CT orbits. She has left-sided encephalomalacia, right-sided weakness and hemisensory loss however unclear why she has deteriorated since first being seen since these are remote. Need to check MRI brain, cervical cord and ct orbits for progression. - Memory loss: Unlikely dementia at this age, also memory changes are not progressive. May be sequelae from intracranial surgery secondary to unknown type of brain lesion and encephalomalacia.  B12, HIV, RPR were normal - Depression and anxiety may be a factor in her multiple somatic and neurologic complaints. Cymbalta. F/u with pcp.  - Follow up 4 months with Jamie Obrien.  Sarina Ill, Jamie Obrien  [facility] [address] [place], [place] [address]  Phone [phone] Fax [phone]  A total of 45 minutes was spent in with this patient, son and interpreter. Over half this time was spent on counseling patient face to face on the brain tumor (unknown), worsening hemiparesis and hemisensory loss and gait abnormality and muktiple complaints diagnosis and different therapeutic options available.

## 2016-05-08 NOTE — Patient Instructions (Signed)
Overall you are doing fairly well but I do want to suggest a few things today:   Remember to drink plenty of fluid, eat healthy meals and do not skip any meals. Try to eat protein with a every meal and eat a healthy snack such as fruit or nuts in between meals. Try to keep a regular sleep-wake schedule and try to exercise daily, particularly in the form of walking, 20-30 minutes a day, if you can.   As far as your medications are concerned, I would like to suggest: Cymbalta daily  As far as diagnostic testing: MRI of the brain and cervical spine. And CT orbits.   I would like to see you back in 4 months, sooner if we need to. Please call us with any interim questions, concerns, problems, updates or refill requests.   Our phone number is [phone]. We also have an after hours call service for urgent matters and there is a physician on-call for urgent questions. For any emergencies you know to call 911 or go to the nearest emergency room  Duloxetine delayed-release capsules What is this medicine? DULOXETINE (doo LOX e teen) is used to treat depression, anxiety, and different types of chronic pain. This medicine may be used for other purposes; ask your health care provider or pharmacist if you have questions. What should I tell my health care provider before I take this medicine? They need to know if you have any of these conditions: -bipolar disorder or a family history of bipolar disorder -glaucoma -kidney disease -liver disease -suicidal thoughts or a previous suicide attempt -taken medicines called MAOIs like Carbex, Eldepryl, Marplan, Nardil, and Parnate within 14 days -an unusual reaction to duloxetine, other medicines, foods, dyes, or preservatives -pregnant or trying to get pregnant -breast-feeding How should I use this medicine? Take this medicine by mouth with a glass of water. Follow the directions on the prescription label. Do not cut, crush or chew this medicine. You can take  this medicine with or without food. Take your medicine at regular intervals. Do not take your medicine more often than directed. Do not stop taking this medicine suddenly except upon the advice of your doctor. Stopping this medicine too quickly may cause serious side effects or your condition may worsen. A special MedGuide will be given to you by the pharmacist with each prescription and refill. Be sure to read this information carefully each time. Talk to your pediatrician regarding the use of this medicine in children. While this drug may be prescribed for children as young as 66 years of age for selected conditions, precautions do apply. Overdosage: If you think you have taken too much of this medicine contact a poison control center or emergency room at once. NOTE: This medicine is only for you. Do not share this medicine with others. What if I miss a dose? If you miss a dose, take it as soon as you can. If it is almost time for your next dose, take only that dose. Do not take double or extra doses. What may interact with this medicine? Do not take this medicine with any of the following medications: -certain diet drugs like dexfenfluramine, fenfluramine -desvenlafaxine -linezolid -MAOIs like Azilect, Carbex, Eldepryl, Marplan, Nardil, and Parnate -methylene blue (intravenous) -milnacipran -thioridazine -venlafaxine This medicine may also interact with the following medications: -alcohol -aspirin and aspirin-like medicines -certain antibiotics like ciprofloxacin and enoxacin -certain medicines for blood pressure, heart disease, irregular heart beat -certain medicines for depression, anxiety, or psychotic disturbances -certain  medicines for migraine headache like almotriptan, eletriptan, frovatriptan, naratriptan, rizatriptan, sumatriptan, zolmitriptan -certain medicines that treat or prevent blood clots like warfarin, enoxaparin, and dalteparin -cimetidine -fentanyl -lithium -NSAIDS,  medicines for pain and inflammation, like ibuprofen or naproxen -phentermine -procarbazine -sibutramine -St. John's wort -theophylline -tramadol -tryptophan This list may not describe all possible interactions. Give your health care provider a list of all the medicines, herbs, non-prescription drugs, or dietary supplements you use. Also tell them if you smoke, drink alcohol, or use illegal drugs. Some items may interact with your medicine. What should I watch for while using this medicine? Tell your doctor if your symptoms do not get better or if they get worse. Visit your doctor or health care professional for regular checks on your progress. Because it may take several weeks to see the full effects of this medicine, it is important to continue your treatment as prescribed by your doctor. Patients and their families should watch out for new or worsening thoughts of suicide or depression. Also watch out for sudden changes in feelings such as feeling anxious, agitated, panicky, irritable, hostile, aggressive, impulsive, severely restless, overly excited and hyperactive, or not being able to sleep. If this happens, especially at the beginning of treatment or after a change in dose, call your health care professional. Dennis Bast may get drowsy or dizzy. Do not drive, use machinery, or do anything that needs mental alertness until you know how this medicine affects you. Do not stand or sit up quickly, especially if you are an older patient. This reduces the risk of dizzy or fainting spells. Alcohol may interfere with the effect of this medicine. Avoid alcoholic drinks. This medicine can cause an increase in blood pressure. This medicine can also cause a sudden drop in your blood pressure, which may make you feel faint and increase the chance of a fall. These effects are most common when you first start the medicine or when the dose is increased, or during use of other medicines that can cause a sudden drop in  blood pressure. Check with your doctor for instructions on monitoring your blood pressure while taking this medicine. Your mouth may get dry. Chewing sugarless gum or sucking hard candy, and drinking plenty of water may help. Contact your doctor if the problem does not go away or is severe. What side effects may I notice from receiving this medicine? Side effects that you should report to your doctor or health care professional as soon as possible: -allergic reactions like skin rash, itching or hives, swelling of the face, lips, or tongue -changes in blood pressure -confusion -dark urine -dizziness -fast talking and excited feelings or actions that are out of control -fast, irregular heartbeat -fever -general ill feeling or flu-like symptoms -hallucination, loss of contact with reality -light-colored stools -loss of balance or coordination -redness, blistering, peeling or loosening of the skin, including inside the mouth -right upper belly pain -seizures -suicidal thoughts or other mood changes -trouble concentrating -trouble passing urine or change in the amount of urine -unusual bleeding or bruising -unusually weak or tired -yellowing of the eyes or skin Side effects that usually do not require medical attention (report to your doctor or health care professional if they continue or are bothersome): -blurred vision -change in appetite -change in sex drive or performance -headache -increased sweating -nausea This list may not describe all possible side effects. Call your doctor for medical advice about side effects. You may report side effects to FDA at 1-800-FDA-1088. Where should I  keep my medicine? Keep out of the reach of children. Store at room temperature between 20 and 25 degrees C (68 to 77 degrees F). Throw away any unused medicine after the expiration date. NOTE: This sheet is a summary. It may not cover all possible information. If you have questions about this  medicine, talk to your doctor, pharmacist, or health care provider.    2016, Elsevier/Gold Standard. (2013-08-05 IX:[id number])

## 2016-05-09 DIAGNOSIS — I10 Essential (primary) hypertension: Secondary | ICD-10-CM | POA: Diagnosis not present

## 2016-05-09 DIAGNOSIS — R2689 Other abnormalities of gait and mobility: Secondary | ICD-10-CM | POA: Diagnosis not present

## 2016-05-09 DIAGNOSIS — F329 Major depressive disorder, single episode, unspecified: Secondary | ICD-10-CM | POA: Diagnosis not present

## 2016-05-09 DIAGNOSIS — R413 Other amnesia: Secondary | ICD-10-CM | POA: Diagnosis not present

## 2016-05-09 DIAGNOSIS — H5411 Blindness, right eye, low vision left eye: Secondary | ICD-10-CM | POA: Diagnosis not present

## 2016-05-09 DIAGNOSIS — D496 Neoplasm of unspecified behavior of brain: Secondary | ICD-10-CM | POA: Diagnosis not present

## 2016-05-10 DIAGNOSIS — R413 Other amnesia: Secondary | ICD-10-CM | POA: Diagnosis not present

## 2016-05-10 DIAGNOSIS — R2689 Other abnormalities of gait and mobility: Secondary | ICD-10-CM | POA: Diagnosis not present

## 2016-05-10 DIAGNOSIS — I10 Essential (primary) hypertension: Secondary | ICD-10-CM | POA: Diagnosis not present

## 2016-05-10 DIAGNOSIS — F329 Major depressive disorder, single episode, unspecified: Secondary | ICD-10-CM | POA: Diagnosis not present

## 2016-05-10 DIAGNOSIS — D496 Neoplasm of unspecified behavior of brain: Secondary | ICD-10-CM | POA: Diagnosis not present

## 2016-05-10 DIAGNOSIS — H5411 Blindness, right eye, low vision left eye: Secondary | ICD-10-CM | POA: Diagnosis not present

## 2016-05-11 ENCOUNTER — Ambulatory Visit (HOSPITAL_COMMUNITY): Payer: Medicare Other

## 2016-05-12 DIAGNOSIS — F329 Major depressive disorder, single episode, unspecified: Secondary | ICD-10-CM | POA: Diagnosis not present

## 2016-05-12 DIAGNOSIS — D496 Neoplasm of unspecified behavior of brain: Secondary | ICD-10-CM | POA: Diagnosis not present

## 2016-05-12 DIAGNOSIS — I10 Essential (primary) hypertension: Secondary | ICD-10-CM | POA: Diagnosis not present

## 2016-05-12 DIAGNOSIS — H5411 Blindness, right eye, low vision left eye: Secondary | ICD-10-CM | POA: Diagnosis not present

## 2016-05-12 DIAGNOSIS — R2689 Other abnormalities of gait and mobility: Secondary | ICD-10-CM | POA: Diagnosis not present

## 2016-05-12 DIAGNOSIS — R413 Other amnesia: Secondary | ICD-10-CM | POA: Diagnosis not present

## 2016-05-15 ENCOUNTER — Ambulatory Visit (HOSPITAL_COMMUNITY): Payer: Medicare Other | Admitting: Physical Therapy

## 2016-05-15 DIAGNOSIS — D496 Neoplasm of unspecified behavior of brain: Secondary | ICD-10-CM | POA: Diagnosis not present

## 2016-05-15 DIAGNOSIS — R2689 Other abnormalities of gait and mobility: Secondary | ICD-10-CM | POA: Diagnosis not present

## 2016-05-15 DIAGNOSIS — H5411 Blindness, right eye, low vision left eye: Secondary | ICD-10-CM | POA: Diagnosis not present

## 2016-05-15 DIAGNOSIS — I10 Essential (primary) hypertension: Secondary | ICD-10-CM | POA: Diagnosis not present

## 2016-05-15 DIAGNOSIS — R413 Other amnesia: Secondary | ICD-10-CM | POA: Diagnosis not present

## 2016-05-15 DIAGNOSIS — F329 Major depressive disorder, single episode, unspecified: Secondary | ICD-10-CM | POA: Diagnosis not present

## 2016-05-16 DIAGNOSIS — I10 Essential (primary) hypertension: Secondary | ICD-10-CM | POA: Diagnosis not present

## 2016-05-16 DIAGNOSIS — H5411 Blindness, right eye, low vision left eye: Secondary | ICD-10-CM | POA: Diagnosis not present

## 2016-05-16 DIAGNOSIS — D496 Neoplasm of unspecified behavior of brain: Secondary | ICD-10-CM | POA: Diagnosis not present

## 2016-05-16 DIAGNOSIS — R2689 Other abnormalities of gait and mobility: Secondary | ICD-10-CM | POA: Diagnosis not present

## 2016-05-16 DIAGNOSIS — F329 Major depressive disorder, single episode, unspecified: Secondary | ICD-10-CM | POA: Diagnosis not present

## 2016-05-16 DIAGNOSIS — R413 Other amnesia: Secondary | ICD-10-CM | POA: Diagnosis not present

## 2016-05-17 ENCOUNTER — Encounter (HOSPITAL_COMMUNITY): Payer: Self-pay

## 2016-05-17 ENCOUNTER — Ambulatory Visit (HOSPITAL_COMMUNITY): Payer: Medicare Other

## 2016-05-17 DIAGNOSIS — F329 Major depressive disorder, single episode, unspecified: Secondary | ICD-10-CM | POA: Diagnosis not present

## 2016-05-17 DIAGNOSIS — H5411 Blindness, right eye, low vision left eye: Secondary | ICD-10-CM | POA: Diagnosis not present

## 2016-05-17 DIAGNOSIS — I10 Essential (primary) hypertension: Secondary | ICD-10-CM | POA: Diagnosis not present

## 2016-05-17 DIAGNOSIS — R413 Other amnesia: Secondary | ICD-10-CM | POA: Diagnosis not present

## 2016-05-17 DIAGNOSIS — D496 Neoplasm of unspecified behavior of brain: Secondary | ICD-10-CM | POA: Diagnosis not present

## 2016-05-17 DIAGNOSIS — R2689 Other abnormalities of gait and mobility: Secondary | ICD-10-CM | POA: Diagnosis not present

## 2016-05-19 ENCOUNTER — Telehealth: Payer: Self-pay | Admitting: Adult Health

## 2016-05-19 DIAGNOSIS — R413 Other amnesia: Secondary | ICD-10-CM | POA: Diagnosis not present

## 2016-05-19 DIAGNOSIS — I10 Essential (primary) hypertension: Secondary | ICD-10-CM | POA: Diagnosis not present

## 2016-05-19 DIAGNOSIS — F329 Major depressive disorder, single episode, unspecified: Secondary | ICD-10-CM | POA: Diagnosis not present

## 2016-05-19 DIAGNOSIS — H5411 Blindness, right eye, low vision left eye: Secondary | ICD-10-CM | POA: Diagnosis not present

## 2016-05-19 DIAGNOSIS — R2689 Other abnormalities of gait and mobility: Secondary | ICD-10-CM | POA: Diagnosis not present

## 2016-05-19 DIAGNOSIS — D496 Neoplasm of unspecified behavior of brain: Secondary | ICD-10-CM | POA: Diagnosis not present

## 2016-05-19 NOTE — Telephone Encounter (Signed)
Jeannie/[facility] [phone] called requesting to speak w/NP, Megan regarding order for CT scan that is scheduled for this Monday September 25th, order for CT of orbits with and w/o IV contrast, Radiologist would like to do just with contrast instead of with and without. Please call.

## 2016-05-19 NOTE — Telephone Encounter (Signed)
Order signed.

## 2016-05-19 NOTE — Telephone Encounter (Signed)
Spoke with Larene Beach at [facility]. I told him that Jinny Blossom was out of the office today but would changed the order when she comes back Monday. He stated that he would go ahead and changed the order on their end and leave patient scheduled for Monday morning and just requested that the order be changed first thing Monday.

## 2016-05-21 ENCOUNTER — Telehealth: Payer: Self-pay | Admitting: Neurology

## 2016-05-21 NOTE — Telephone Encounter (Signed)
Terrence Dupont, please mail my latest office note, patient instructions and the prescription for requip to the following for patient thanks:  mail to [address] [place] [address] [address] [facility] Attn: Skeet Simmer

## 2016-05-22 ENCOUNTER — Ambulatory Visit
Admission: RE | Admit: 2016-05-22 | Discharge: 2016-05-22 | Disposition: A | Discharge status: Home / Self Care | Payer: Medicare Other | Source: Ambulatory Visit | Attending: Neurology | Admitting: Neurology

## 2016-05-22 ENCOUNTER — Ambulatory Visit
Admission: RE | Admit: 2016-05-22 | Discharge: 2016-05-22 | Disposition: A | Discharge status: Home / Self Care | Payer: Medicare Other | Source: Ambulatory Visit | Attending: Adult Health | Admitting: Adult Health

## 2016-05-22 ENCOUNTER — Ambulatory Visit (HOSPITAL_COMMUNITY): Payer: Medicare Other | Admitting: Physical Therapy

## 2016-05-22 DIAGNOSIS — R930 Abnormal findings on diagnostic imaging of skull and head, not elsewhere classified: Secondary | ICD-10-CM

## 2016-05-22 DIAGNOSIS — G8929 Other chronic pain: Secondary | ICD-10-CM

## 2016-05-22 DIAGNOSIS — G8190 Hemiplegia, unspecified affecting unspecified side: Secondary | ICD-10-CM

## 2016-05-22 DIAGNOSIS — R269 Unspecified abnormalities of gait and mobility: Secondary | ICD-10-CM | POA: Diagnosis not present

## 2016-05-22 DIAGNOSIS — M542 Cervicalgia: Secondary | ICD-10-CM | POA: Diagnosis not present

## 2016-05-22 DIAGNOSIS — R200 Anesthesia of skin: Secondary | ICD-10-CM

## 2016-05-22 DIAGNOSIS — R51 Headache: Secondary | ICD-10-CM | POA: Diagnosis not present

## 2016-05-22 DIAGNOSIS — R2689 Other abnormalities of gait and mobility: Secondary | ICD-10-CM

## 2016-05-22 DIAGNOSIS — D496 Neoplasm of unspecified behavior of brain: Secondary | ICD-10-CM

## 2016-05-22 DIAGNOSIS — M4802 Spinal stenosis, cervical region: Secondary | ICD-10-CM

## 2016-05-22 DIAGNOSIS — R413 Other amnesia: Secondary | ICD-10-CM

## 2016-05-22 MED ORDER — IOPAMIDOL (ISOVUE-300) INJECTION 61%: 75.0000 mL | Freq: Once | INTRAVENOUS | Status: DC | PRN

## 2016-05-22 MED ORDER — GADOBENATE DIMEGLUMINE 529 MG/ML IV SOLN
13.0000 mL | Freq: Once | INTRAVENOUS | Status: AC | PRN
  Administered 2016-05-22: 13 mL via INTRAVENOUS

## 2016-05-22 NOTE — Telephone Encounter (Signed)
Dr Jaynee Eagles- Error.  Did you want to fix this? Thank you

## 2016-05-22 NOTE — Telephone Encounter (Signed)
This was in error sorry

## 2016-05-24 ENCOUNTER — Ambulatory Visit (HOSPITAL_COMMUNITY): Payer: Medicare Other

## 2016-05-24 DIAGNOSIS — R2689 Other abnormalities of gait and mobility: Secondary | ICD-10-CM | POA: Diagnosis not present

## 2016-05-24 DIAGNOSIS — H5411 Blindness, right eye, low vision left eye: Secondary | ICD-10-CM | POA: Diagnosis not present

## 2016-05-24 DIAGNOSIS — D496 Neoplasm of unspecified behavior of brain: Secondary | ICD-10-CM | POA: Diagnosis not present

## 2016-05-24 DIAGNOSIS — F329 Major depressive disorder, single episode, unspecified: Secondary | ICD-10-CM | POA: Diagnosis not present

## 2016-05-24 DIAGNOSIS — I10 Essential (primary) hypertension: Secondary | ICD-10-CM | POA: Diagnosis not present

## 2016-05-24 DIAGNOSIS — R413 Other amnesia: Secondary | ICD-10-CM | POA: Diagnosis not present

## 2016-05-25 DIAGNOSIS — R2689 Other abnormalities of gait and mobility: Secondary | ICD-10-CM | POA: Diagnosis not present

## 2016-05-25 DIAGNOSIS — H5411 Blindness, right eye, low vision left eye: Secondary | ICD-10-CM | POA: Diagnosis not present

## 2016-05-25 DIAGNOSIS — D496 Neoplasm of unspecified behavior of brain: Secondary | ICD-10-CM | POA: Diagnosis not present

## 2016-05-25 DIAGNOSIS — I10 Essential (primary) hypertension: Secondary | ICD-10-CM | POA: Diagnosis not present

## 2016-05-25 DIAGNOSIS — F329 Major depressive disorder, single episode, unspecified: Secondary | ICD-10-CM | POA: Diagnosis not present

## 2016-05-25 DIAGNOSIS — R413 Other amnesia: Secondary | ICD-10-CM | POA: Diagnosis not present

## 2016-05-26 DIAGNOSIS — H5411 Blindness, right eye, low vision left eye: Secondary | ICD-10-CM | POA: Diagnosis not present

## 2016-05-26 DIAGNOSIS — D496 Neoplasm of unspecified behavior of brain: Secondary | ICD-10-CM | POA: Diagnosis not present

## 2016-05-26 DIAGNOSIS — I10 Essential (primary) hypertension: Secondary | ICD-10-CM | POA: Diagnosis not present

## 2016-05-26 DIAGNOSIS — F329 Major depressive disorder, single episode, unspecified: Secondary | ICD-10-CM | POA: Diagnosis not present

## 2016-05-26 DIAGNOSIS — R413 Other amnesia: Secondary | ICD-10-CM | POA: Diagnosis not present

## 2016-05-26 DIAGNOSIS — R2689 Other abnormalities of gait and mobility: Secondary | ICD-10-CM | POA: Diagnosis not present

## 2016-05-29 ENCOUNTER — Ambulatory Visit (HOSPITAL_COMMUNITY): Payer: Medicare Other | Admitting: Physical Therapy

## 2016-05-29 DIAGNOSIS — I10 Essential (primary) hypertension: Secondary | ICD-10-CM | POA: Diagnosis not present

## 2016-05-29 DIAGNOSIS — H5411 Blindness, right eye, low vision left eye: Secondary | ICD-10-CM | POA: Diagnosis not present

## 2016-05-29 DIAGNOSIS — R2689 Other abnormalities of gait and mobility: Secondary | ICD-10-CM | POA: Diagnosis not present

## 2016-05-29 DIAGNOSIS — D496 Neoplasm of unspecified behavior of brain: Secondary | ICD-10-CM | POA: Diagnosis not present

## 2016-05-29 DIAGNOSIS — F329 Major depressive disorder, single episode, unspecified: Secondary | ICD-10-CM | POA: Diagnosis not present

## 2016-05-29 DIAGNOSIS — R413 Other amnesia: Secondary | ICD-10-CM | POA: Diagnosis not present

## 2016-05-30 ENCOUNTER — Telehealth: Payer: Self-pay | Admitting: *Deleted

## 2016-05-30 DIAGNOSIS — D496 Neoplasm of unspecified behavior of brain: Secondary | ICD-10-CM | POA: Diagnosis not present

## 2016-05-30 DIAGNOSIS — R413 Other amnesia: Secondary | ICD-10-CM | POA: Diagnosis not present

## 2016-05-30 DIAGNOSIS — R2689 Other abnormalities of gait and mobility: Secondary | ICD-10-CM | POA: Diagnosis not present

## 2016-05-30 DIAGNOSIS — H5411 Blindness, right eye, low vision left eye: Secondary | ICD-10-CM | POA: Diagnosis not present

## 2016-05-30 DIAGNOSIS — F329 Major depressive disorder, single episode, unspecified: Secondary | ICD-10-CM | POA: Diagnosis not present

## 2016-05-30 DIAGNOSIS — I10 Essential (primary) hypertension: Secondary | ICD-10-CM | POA: Diagnosis not present

## 2016-05-30 NOTE — Telephone Encounter (Signed)
[place] interpreter line.ID [id number] Charlett Nose).  She called patient son, Rosaria Ferries and LVM for him to call office back. She gave [facility] phone number.

## 2016-05-30 NOTE — Telephone Encounter (Signed)
Pt's friend Adonis Huguenin called to get results of MRI for the pt. I advised the son was told this morning. She still requested a call back.[phone]

## 2016-05-30 NOTE — Telephone Encounter (Signed)
Called and spoke to pt son, Rosaria Ferries about results per Dr Jaynee Eagles note. He verbalized understanding. He asked when next f/u was for pt. I informed him next scheduled appt is 08/07/16 at 930am. He verbalized understanding.

## 2016-05-30 NOTE — Telephone Encounter (Signed)
-----   Message from Melvenia Beam, MD sent at 05/29/2016  7:16 PM EDT ----- MRI of the brain and cervical cord are stable. No reason seen for her worsening symptoms.

## 2016-05-30 NOTE — Telephone Encounter (Signed)
LVM returning [place] call. Asked her to call back regarding results. Gave [facility] phone number

## 2016-05-31 DIAGNOSIS — H5411 Blindness, right eye, low vision left eye: Secondary | ICD-10-CM | POA: Diagnosis not present

## 2016-05-31 DIAGNOSIS — R413 Other amnesia: Secondary | ICD-10-CM | POA: Diagnosis not present

## 2016-05-31 DIAGNOSIS — D496 Neoplasm of unspecified behavior of brain: Secondary | ICD-10-CM | POA: Diagnosis not present

## 2016-05-31 DIAGNOSIS — F329 Major depressive disorder, single episode, unspecified: Secondary | ICD-10-CM | POA: Diagnosis not present

## 2016-05-31 DIAGNOSIS — R2689 Other abnormalities of gait and mobility: Secondary | ICD-10-CM | POA: Diagnosis not present

## 2016-05-31 DIAGNOSIS — I10 Essential (primary) hypertension: Secondary | ICD-10-CM | POA: Diagnosis not present

## 2016-05-31 NOTE — Telephone Encounter (Signed)
LVM for Jamie Obrien returning her call. Gave [facility] phone number for her to call.

## 2016-06-02 ENCOUNTER — Ambulatory Visit (HOSPITAL_COMMUNITY): Payer: Medicare Other

## 2016-06-02 DIAGNOSIS — R2689 Other abnormalities of gait and mobility: Secondary | ICD-10-CM | POA: Diagnosis not present

## 2016-06-02 DIAGNOSIS — I10 Essential (primary) hypertension: Secondary | ICD-10-CM | POA: Diagnosis not present

## 2016-06-02 DIAGNOSIS — F329 Major depressive disorder, single episode, unspecified: Secondary | ICD-10-CM | POA: Diagnosis not present

## 2016-06-02 DIAGNOSIS — H5411 Blindness, right eye, low vision left eye: Secondary | ICD-10-CM | POA: Diagnosis not present

## 2016-06-02 DIAGNOSIS — D496 Neoplasm of unspecified behavior of brain: Secondary | ICD-10-CM | POA: Diagnosis not present

## 2016-06-02 DIAGNOSIS — R413 Other amnesia: Secondary | ICD-10-CM | POA: Diagnosis not present

## 2016-06-05 ENCOUNTER — Ambulatory Visit (HOSPITAL_COMMUNITY): Payer: Medicare Other | Admitting: Physical Therapy

## 2016-06-05 DIAGNOSIS — R2689 Other abnormalities of gait and mobility: Secondary | ICD-10-CM | POA: Diagnosis not present

## 2016-06-05 DIAGNOSIS — I10 Essential (primary) hypertension: Secondary | ICD-10-CM | POA: Diagnosis not present

## 2016-06-05 DIAGNOSIS — R413 Other amnesia: Secondary | ICD-10-CM | POA: Diagnosis not present

## 2016-06-05 DIAGNOSIS — R748 Abnormal levels of other serum enzymes: Secondary | ICD-10-CM | POA: Diagnosis not present

## 2016-06-05 DIAGNOSIS — R5383 Other fatigue: Secondary | ICD-10-CM | POA: Diagnosis not present

## 2016-06-05 DIAGNOSIS — D496 Neoplasm of unspecified behavior of brain: Secondary | ICD-10-CM | POA: Diagnosis not present

## 2016-06-05 DIAGNOSIS — R410 Disorientation, unspecified: Secondary | ICD-10-CM | POA: Diagnosis not present

## 2016-06-05 DIAGNOSIS — H5411 Blindness, right eye, low vision left eye: Secondary | ICD-10-CM | POA: Diagnosis not present

## 2016-06-05 DIAGNOSIS — F329 Major depressive disorder, single episode, unspecified: Secondary | ICD-10-CM | POA: Diagnosis not present

## 2016-06-07 DIAGNOSIS — H5411 Blindness, right eye, low vision left eye: Secondary | ICD-10-CM | POA: Diagnosis not present

## 2016-06-07 DIAGNOSIS — I10 Essential (primary) hypertension: Secondary | ICD-10-CM | POA: Diagnosis not present

## 2016-06-07 DIAGNOSIS — R2689 Other abnormalities of gait and mobility: Secondary | ICD-10-CM | POA: Diagnosis not present

## 2016-06-07 DIAGNOSIS — R413 Other amnesia: Secondary | ICD-10-CM | POA: Diagnosis not present

## 2016-06-07 DIAGNOSIS — D496 Neoplasm of unspecified behavior of brain: Secondary | ICD-10-CM | POA: Diagnosis not present

## 2016-06-07 DIAGNOSIS — F329 Major depressive disorder, single episode, unspecified: Secondary | ICD-10-CM | POA: Diagnosis not present

## 2016-06-09 ENCOUNTER — Ambulatory Visit (HOSPITAL_COMMUNITY): Payer: Medicare Other

## 2016-06-09 DIAGNOSIS — L259 Unspecified contact dermatitis, unspecified cause: Secondary | ICD-10-CM | POA: Diagnosis not present

## 2016-06-09 DIAGNOSIS — R42 Dizziness and giddiness: Secondary | ICD-10-CM | POA: Diagnosis not present

## 2016-06-12 ENCOUNTER — Ambulatory Visit (HOSPITAL_COMMUNITY): Payer: Medicare Other | Admitting: Physical Therapy

## 2016-06-12 DIAGNOSIS — F329 Major depressive disorder, single episode, unspecified: Secondary | ICD-10-CM | POA: Diagnosis not present

## 2016-06-12 DIAGNOSIS — R413 Other amnesia: Secondary | ICD-10-CM | POA: Diagnosis not present

## 2016-06-12 DIAGNOSIS — R2689 Other abnormalities of gait and mobility: Secondary | ICD-10-CM | POA: Diagnosis not present

## 2016-06-12 DIAGNOSIS — D496 Neoplasm of unspecified behavior of brain: Secondary | ICD-10-CM | POA: Diagnosis not present

## 2016-06-12 DIAGNOSIS — H5411 Blindness, right eye, low vision left eye: Secondary | ICD-10-CM | POA: Diagnosis not present

## 2016-06-12 DIAGNOSIS — I10 Essential (primary) hypertension: Secondary | ICD-10-CM | POA: Diagnosis not present

## 2016-06-14 DIAGNOSIS — I10 Essential (primary) hypertension: Secondary | ICD-10-CM | POA: Diagnosis not present

## 2016-06-14 DIAGNOSIS — D496 Neoplasm of unspecified behavior of brain: Secondary | ICD-10-CM | POA: Diagnosis not present

## 2016-06-14 DIAGNOSIS — R2689 Other abnormalities of gait and mobility: Secondary | ICD-10-CM | POA: Diagnosis not present

## 2016-06-14 DIAGNOSIS — F329 Major depressive disorder, single episode, unspecified: Secondary | ICD-10-CM | POA: Diagnosis not present

## 2016-06-14 DIAGNOSIS — R413 Other amnesia: Secondary | ICD-10-CM | POA: Diagnosis not present

## 2016-06-14 DIAGNOSIS — H5411 Blindness, right eye, low vision left eye: Secondary | ICD-10-CM | POA: Diagnosis not present

## 2016-06-15 ENCOUNTER — Ambulatory Visit (HOSPITAL_COMMUNITY): Payer: Medicare Other

## 2016-06-19 DIAGNOSIS — H5411 Blindness, right eye, low vision left eye: Secondary | ICD-10-CM | POA: Diagnosis not present

## 2016-06-19 DIAGNOSIS — D496 Neoplasm of unspecified behavior of brain: Secondary | ICD-10-CM | POA: Diagnosis not present

## 2016-06-19 DIAGNOSIS — R2689 Other abnormalities of gait and mobility: Secondary | ICD-10-CM | POA: Diagnosis not present

## 2016-06-19 DIAGNOSIS — F329 Major depressive disorder, single episode, unspecified: Secondary | ICD-10-CM | POA: Diagnosis not present

## 2016-06-19 DIAGNOSIS — I10 Essential (primary) hypertension: Secondary | ICD-10-CM | POA: Diagnosis not present

## 2016-06-19 DIAGNOSIS — R413 Other amnesia: Secondary | ICD-10-CM | POA: Diagnosis not present

## 2016-06-21 DIAGNOSIS — F329 Major depressive disorder, single episode, unspecified: Secondary | ICD-10-CM | POA: Diagnosis not present

## 2016-06-21 DIAGNOSIS — I10 Essential (primary) hypertension: Secondary | ICD-10-CM | POA: Diagnosis not present

## 2016-06-21 DIAGNOSIS — R413 Other amnesia: Secondary | ICD-10-CM | POA: Diagnosis not present

## 2016-06-21 DIAGNOSIS — R2689 Other abnormalities of gait and mobility: Secondary | ICD-10-CM | POA: Diagnosis not present

## 2016-06-21 DIAGNOSIS — D496 Neoplasm of unspecified behavior of brain: Secondary | ICD-10-CM | POA: Diagnosis not present

## 2016-06-21 DIAGNOSIS — H5411 Blindness, right eye, low vision left eye: Secondary | ICD-10-CM | POA: Diagnosis not present

## 2016-06-28 DIAGNOSIS — F329 Major depressive disorder, single episode, unspecified: Secondary | ICD-10-CM | POA: Diagnosis not present

## 2016-06-28 DIAGNOSIS — R2689 Other abnormalities of gait and mobility: Secondary | ICD-10-CM | POA: Diagnosis not present

## 2016-06-28 DIAGNOSIS — D496 Neoplasm of unspecified behavior of brain: Secondary | ICD-10-CM | POA: Diagnosis not present

## 2016-06-28 DIAGNOSIS — H5411 Blindness, right eye, low vision left eye: Secondary | ICD-10-CM | POA: Diagnosis not present

## 2016-06-28 DIAGNOSIS — I10 Essential (primary) hypertension: Secondary | ICD-10-CM | POA: Diagnosis not present

## 2016-06-28 DIAGNOSIS — R413 Other amnesia: Secondary | ICD-10-CM | POA: Diagnosis not present

## 2016-06-30 DIAGNOSIS — I10 Essential (primary) hypertension: Secondary | ICD-10-CM | POA: Diagnosis not present

## 2016-06-30 DIAGNOSIS — H5411 Blindness, right eye, low vision left eye: Secondary | ICD-10-CM | POA: Diagnosis not present

## 2016-06-30 DIAGNOSIS — D496 Neoplasm of unspecified behavior of brain: Secondary | ICD-10-CM | POA: Diagnosis not present

## 2016-06-30 DIAGNOSIS — F329 Major depressive disorder, single episode, unspecified: Secondary | ICD-10-CM | POA: Diagnosis not present

## 2016-06-30 DIAGNOSIS — R2689 Other abnormalities of gait and mobility: Secondary | ICD-10-CM | POA: Diagnosis not present

## 2016-06-30 DIAGNOSIS — R413 Other amnesia: Secondary | ICD-10-CM | POA: Diagnosis not present

## 2016-07-03 DIAGNOSIS — R42 Dizziness and giddiness: Secondary | ICD-10-CM | POA: Diagnosis not present

## 2016-07-04 DIAGNOSIS — R413 Other amnesia: Secondary | ICD-10-CM | POA: Diagnosis not present

## 2016-07-04 DIAGNOSIS — H5411 Blindness, right eye, low vision left eye: Secondary | ICD-10-CM | POA: Diagnosis not present

## 2016-07-04 DIAGNOSIS — F329 Major depressive disorder, single episode, unspecified: Secondary | ICD-10-CM | POA: Diagnosis not present

## 2016-07-04 DIAGNOSIS — D496 Neoplasm of unspecified behavior of brain: Secondary | ICD-10-CM | POA: Diagnosis not present

## 2016-07-04 DIAGNOSIS — I10 Essential (primary) hypertension: Secondary | ICD-10-CM | POA: Diagnosis not present

## 2016-07-04 DIAGNOSIS — R2689 Other abnormalities of gait and mobility: Secondary | ICD-10-CM | POA: Diagnosis not present

## 2016-07-07 DIAGNOSIS — R413 Other amnesia: Secondary | ICD-10-CM | POA: Diagnosis not present

## 2016-07-07 DIAGNOSIS — F329 Major depressive disorder, single episode, unspecified: Secondary | ICD-10-CM | POA: Diagnosis not present

## 2016-07-07 DIAGNOSIS — R2689 Other abnormalities of gait and mobility: Secondary | ICD-10-CM | POA: Diagnosis not present

## 2016-07-07 DIAGNOSIS — H5411 Blindness, right eye, low vision left eye: Secondary | ICD-10-CM | POA: Diagnosis not present

## 2016-07-07 DIAGNOSIS — I10 Essential (primary) hypertension: Secondary | ICD-10-CM | POA: Diagnosis not present

## 2016-07-07 DIAGNOSIS — D496 Neoplasm of unspecified behavior of brain: Secondary | ICD-10-CM | POA: Diagnosis not present

## 2016-07-08 DIAGNOSIS — D496 Neoplasm of unspecified behavior of brain: Secondary | ICD-10-CM | POA: Diagnosis not present

## 2016-07-08 DIAGNOSIS — R413 Other amnesia: Secondary | ICD-10-CM | POA: Diagnosis not present

## 2016-07-08 DIAGNOSIS — F329 Major depressive disorder, single episode, unspecified: Secondary | ICD-10-CM | POA: Diagnosis not present

## 2016-07-08 DIAGNOSIS — R2689 Other abnormalities of gait and mobility: Secondary | ICD-10-CM | POA: Diagnosis not present

## 2016-07-08 DIAGNOSIS — I10 Essential (primary) hypertension: Secondary | ICD-10-CM | POA: Diagnosis not present

## 2016-07-12 DIAGNOSIS — R2689 Other abnormalities of gait and mobility: Secondary | ICD-10-CM | POA: Diagnosis not present

## 2016-07-12 DIAGNOSIS — I10 Essential (primary) hypertension: Secondary | ICD-10-CM | POA: Diagnosis not present

## 2016-07-12 DIAGNOSIS — R413 Other amnesia: Secondary | ICD-10-CM | POA: Diagnosis not present

## 2016-07-12 DIAGNOSIS — D496 Neoplasm of unspecified behavior of brain: Secondary | ICD-10-CM | POA: Diagnosis not present

## 2016-07-12 DIAGNOSIS — F329 Major depressive disorder, single episode, unspecified: Secondary | ICD-10-CM | POA: Diagnosis not present

## 2016-07-14 DIAGNOSIS — F329 Major depressive disorder, single episode, unspecified: Secondary | ICD-10-CM | POA: Diagnosis not present

## 2016-07-14 DIAGNOSIS — R2689 Other abnormalities of gait and mobility: Secondary | ICD-10-CM | POA: Diagnosis not present

## 2016-07-14 DIAGNOSIS — I10 Essential (primary) hypertension: Secondary | ICD-10-CM | POA: Diagnosis not present

## 2016-07-14 DIAGNOSIS — R413 Other amnesia: Secondary | ICD-10-CM | POA: Diagnosis not present

## 2016-07-14 DIAGNOSIS — D496 Neoplasm of unspecified behavior of brain: Secondary | ICD-10-CM | POA: Diagnosis not present

## 2016-07-17 DIAGNOSIS — M791 Myalgia: Secondary | ICD-10-CM | POA: Diagnosis not present

## 2016-07-17 DIAGNOSIS — R5383 Other fatigue: Secondary | ICD-10-CM | POA: Diagnosis not present

## 2016-07-17 DIAGNOSIS — R531 Weakness: Secondary | ICD-10-CM | POA: Diagnosis not present

## 2016-07-17 DIAGNOSIS — R202 Paresthesia of skin: Secondary | ICD-10-CM | POA: Diagnosis not present

## 2016-07-17 DIAGNOSIS — R768 Other specified abnormal immunological findings in serum: Secondary | ICD-10-CM | POA: Diagnosis not present

## 2016-07-18 DIAGNOSIS — I10 Essential (primary) hypertension: Secondary | ICD-10-CM | POA: Diagnosis not present

## 2016-07-18 DIAGNOSIS — R2689 Other abnormalities of gait and mobility: Secondary | ICD-10-CM | POA: Diagnosis not present

## 2016-07-18 DIAGNOSIS — F329 Major depressive disorder, single episode, unspecified: Secondary | ICD-10-CM | POA: Diagnosis not present

## 2016-07-18 DIAGNOSIS — D496 Neoplasm of unspecified behavior of brain: Secondary | ICD-10-CM | POA: Diagnosis not present

## 2016-07-18 DIAGNOSIS — R413 Other amnesia: Secondary | ICD-10-CM | POA: Diagnosis not present

## 2016-07-19 DIAGNOSIS — R413 Other amnesia: Secondary | ICD-10-CM | POA: Diagnosis not present

## 2016-07-19 DIAGNOSIS — I10 Essential (primary) hypertension: Secondary | ICD-10-CM | POA: Diagnosis not present

## 2016-07-19 DIAGNOSIS — R2689 Other abnormalities of gait and mobility: Secondary | ICD-10-CM | POA: Diagnosis not present

## 2016-07-19 DIAGNOSIS — D496 Neoplasm of unspecified behavior of brain: Secondary | ICD-10-CM | POA: Diagnosis not present

## 2016-07-19 DIAGNOSIS — F329 Major depressive disorder, single episode, unspecified: Secondary | ICD-10-CM | POA: Diagnosis not present

## 2016-07-25 DIAGNOSIS — F329 Major depressive disorder, single episode, unspecified: Secondary | ICD-10-CM | POA: Diagnosis not present

## 2016-07-25 DIAGNOSIS — R2689 Other abnormalities of gait and mobility: Secondary | ICD-10-CM | POA: Diagnosis not present

## 2016-07-25 DIAGNOSIS — I10 Essential (primary) hypertension: Secondary | ICD-10-CM | POA: Diagnosis not present

## 2016-07-25 DIAGNOSIS — D496 Neoplasm of unspecified behavior of brain: Secondary | ICD-10-CM | POA: Diagnosis not present

## 2016-07-25 DIAGNOSIS — R413 Other amnesia: Secondary | ICD-10-CM | POA: Diagnosis not present

## 2016-07-28 DIAGNOSIS — I10 Essential (primary) hypertension: Secondary | ICD-10-CM | POA: Diagnosis not present

## 2016-07-28 DIAGNOSIS — R2689 Other abnormalities of gait and mobility: Secondary | ICD-10-CM | POA: Diagnosis not present

## 2016-07-28 DIAGNOSIS — D496 Neoplasm of unspecified behavior of brain: Secondary | ICD-10-CM | POA: Diagnosis not present

## 2016-07-28 DIAGNOSIS — F329 Major depressive disorder, single episode, unspecified: Secondary | ICD-10-CM | POA: Diagnosis not present

## 2016-07-28 DIAGNOSIS — R413 Other amnesia: Secondary | ICD-10-CM | POA: Diagnosis not present

## 2016-07-31 ENCOUNTER — Other Ambulatory Visit: Payer: Self-pay | Admitting: Rheumatology

## 2016-07-31 DIAGNOSIS — R202 Paresthesia of skin: Secondary | ICD-10-CM | POA: Diagnosis not present

## 2016-07-31 DIAGNOSIS — R5383 Other fatigue: Secondary | ICD-10-CM | POA: Diagnosis not present

## 2016-07-31 DIAGNOSIS — R768 Other specified abnormal immunological findings in serum: Secondary | ICD-10-CM | POA: Diagnosis not present

## 2016-07-31 DIAGNOSIS — R531 Weakness: Secondary | ICD-10-CM | POA: Diagnosis not present

## 2016-07-31 DIAGNOSIS — E663 Overweight: Secondary | ICD-10-CM | POA: Diagnosis not present

## 2016-07-31 DIAGNOSIS — Z6827 Body mass index (BMI) 27.0-27.9, adult: Secondary | ICD-10-CM | POA: Diagnosis not present

## 2016-07-31 DIAGNOSIS — M791 Myalgia: Secondary | ICD-10-CM | POA: Diagnosis not present

## 2016-08-02 DIAGNOSIS — I10 Essential (primary) hypertension: Secondary | ICD-10-CM | POA: Diagnosis not present

## 2016-08-02 DIAGNOSIS — D496 Neoplasm of unspecified behavior of brain: Secondary | ICD-10-CM | POA: Diagnosis not present

## 2016-08-02 DIAGNOSIS — R413 Other amnesia: Secondary | ICD-10-CM | POA: Diagnosis not present

## 2016-08-02 DIAGNOSIS — R2689 Other abnormalities of gait and mobility: Secondary | ICD-10-CM | POA: Diagnosis not present

## 2016-08-02 DIAGNOSIS — F329 Major depressive disorder, single episode, unspecified: Secondary | ICD-10-CM | POA: Diagnosis not present

## 2016-08-03 DIAGNOSIS — F329 Major depressive disorder, single episode, unspecified: Secondary | ICD-10-CM | POA: Diagnosis not present

## 2016-08-03 DIAGNOSIS — R2689 Other abnormalities of gait and mobility: Secondary | ICD-10-CM | POA: Diagnosis not present

## 2016-08-03 DIAGNOSIS — D496 Neoplasm of unspecified behavior of brain: Secondary | ICD-10-CM | POA: Diagnosis not present

## 2016-08-03 DIAGNOSIS — R413 Other amnesia: Secondary | ICD-10-CM | POA: Diagnosis not present

## 2016-08-03 DIAGNOSIS — I10 Essential (primary) hypertension: Secondary | ICD-10-CM | POA: Diagnosis not present

## 2016-08-07 ENCOUNTER — Telehealth: Payer: Self-pay | Admitting: *Deleted

## 2016-08-07 ENCOUNTER — Ambulatory Visit: Admission: RE | Admit: 2016-08-07 | Payer: Medicare Other | Source: Ambulatory Visit

## 2016-08-07 ENCOUNTER — Ambulatory Visit (INDEPENDENT_AMBULATORY_CARE_PROVIDER_SITE_OTHER): Payer: Medicare Other | Admitting: Neurology

## 2016-08-07 ENCOUNTER — Encounter: Payer: Self-pay | Admitting: Neurology

## 2016-08-07 VITALS — BP 126/77 | HR 81 | Ht 61.0 in | Wt 151.8 lb

## 2016-08-07 DIAGNOSIS — M8500 Fibrous dysplasia (monostotic), unspecified site: Secondary | ICD-10-CM

## 2016-08-07 NOTE — Telephone Encounter (Signed)
Called and spoke to [place] at pt pharmacy. Cx rx cymbalta on file per AA,MD request.

## 2016-08-07 NOTE — Patient Instructions (Addendum)
Remember to drink plenty of fluid, eat healthy meals and do not skip any meals. Try to eat protein with a every meal and eat a healthy snack such as fruit or nuts in between meals. Try to keep a regular sleep-wake schedule and try to exercise daily, particularly in the form of walking, 20-30 minutes a day, if you can.    I would like to see you back in 6-9 months, sooner if we need to. Please call us with any interim questions, concerns, problems, updates or refill requests.   Follow up with Rheumatology Follow up with primary care for management of mood  Our phone number is [phone]. We also have an after hours call service for urgent matters and there is a physician on-call for urgent questions. For any emergencies you know to call 911 or go to the nearest emergency room

## 2016-08-07 NOTE — Progress Notes (Signed)
[facility]    Provider:  Dr Jaynee Eagles Referring Provider: Dettinger, Fransisca Kaufmann, MD  CC: Memory problems  Interval history 08/07/2016: 55 year old female here for evaluation of memory changes, increased difficulty walking, multiple somatic and other neurologic complaints. She also has depression and Anxiety, untreated. Some inconsistent complaints and clinical findings.  They are back for follow-up. At last appointment I started Cymbalta for her headaches as well as her mood and multiple other complaints however son says that he forgot to pick it up and she never started it. He says that she is doing better. 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 At this point if she feels improved I will discontinue the Cymbalta and she can follow up with her primary care and see [place] back in one year.  Interval history 05/08/2016: She never had the MRI of the brain w/wo contrast or the CT of the orbits completed, multiple messages were left, son says he received the messages but had too much going on in his life. Walking is not better. She can hardly walk. The walking is still difficult but not worsening. She is here with an interpreter. She has been getting physical therapy. Everything feels numb, her arms, she has a lot of neck pain especially if she turns her head. Son says he had too much on his mind and he never got back to [place] for the imaging, he doesn't really know why. She can't walk. All of a sudden she is not moving, walking is worse, she is using a walker, son is trying to get her a nurse. Will orde rimaging of the brain and neck again and bring son and patient to our scheduler today to get it all set today. She reports weakness, headache, the right arm just drops. The right leg drops as well. The left side is easier to move.  Lower abdomen feels numb. She has back pain. She cries in the office today. She is having urinary incontinence. She has headaches. She has only had a headache 2x since being here last. Pounding, on the right side and radiates to the back. She has a lot of back pain and neck pain and numbness and tingling.   MRI of the brain 01/21/2016: IMPRESSION: Postsurgical changes of the greater wing of the right sphenoid, lateral wall of the right orbit and right temporal bone. 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 Currently, bony expansion of the greater wing of the right sphenoid and lateral wall of right orbit contributes to inward displacement of the right lateral rectus muscle and narrowing of the right orbital apex. This may compromise the right optic nerve. Patient would benefit from dedicated orbital CT in addition to obtaining any prior exams for comparison.  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  No acute infarct.  Opacification right sphenoid sinus.    Interval history 01/31/16 [facility]): [name] is a 55 year old female with a history of memory disturbance. She returns today for evaluation. 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 At the last visit with Dr. Jaynee Eagles she ordered  an MRI of the brain the patient had this done last month.she is also been to the [facility] emergency room twice- for chest pain, headache and paresthesia. She reports that her memory has remained the same. She operates a Teacher, music. Denies getting lost going to familiar places. She states that she is able to complete all ADLs independently. However she does feel that it is a "struggle at times." She  states that she has a hard time grasping objects. She reports that this started in April. She also developed tremors. She states that on April 24 she had dizziness and was treated for vertigo. Also since the last visit she has been treated for chest pain. She states that the chest pain has continued and radiates to the back. She was seen at [facility] for this discomfort. Several tests was completed however the results are not available to me. The patient also reports stiffness in the arms and across the neck. She states that the left leg feels as if it can give out at times. She also complains of burning and tingling in the hands and feet. She denies being diabetic.she also reports left facial numbness. She also saw her primary care recently for these same symptoms. He reordered an MRI that she completed in May.  HPI 10/25/2015:Jamie J Garcia-Perezis a 55 y.o.femalehere as a referral from Dr. Massie Kluver memory problems. PMHx depression, anxiety, HLD. Son provides information. Interpreter here as well. They started in 2012. 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 The surgery was in [place]. She does not have any information such as what kind of tumor. She was eaving headaches all her life and they discovered the tumor in 2004 and it was removed in 2006 and then again in 2011. She has pain around the right eye and right side which has been going on for years.She feels the memory changes started after surgery. Not progressive. For example, she forgot where she parked her car in [place] and she started crying but she calmed down and eventually found it. She can't even remember what her date of birth is sometimes and remembering names. She loses things. Not getting worse. Memory problems are not getting worse. She has anxiety. She has depression and cries a lot. Son says she does have depression. She feels like she has been depressed since the surgery. It has been a tough  since the surgery and then no offer for follow up or psychology and that was never addressed. She has decreased energy. She has fatigue. She snores excessively and she wakes up and snoring or feeling she can't breath does wake her often. She has morning headaches. Feels like she never rests well. She recently had a sleep study and awaiting results. No history of Alzheimer's or dementia in the family.  Reviewed notes, labs and imaging from outside physicians, which showed: HgbA1c 5.7, CMP, CBC unremarkable. TSH nml.   IMPRESSION: Postsurgical changes of the greater wing of the right sphenoid, lateral wall of the right orbit and right temporal bone. 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 Currently, bony expansion of the greater wing of the right sphenoid and lateral wall of right orbit contributes to inward displacement of the right lateral rectus muscle and narrowing of the right orbital apex. This may compromise the right optic nerve. Patient would benefit from dedicated orbital CT in addition to obtaining any prior exams for comparison.  Encephalomalacia anterior right temporal lobe and anterior  inferior right frontal lobe.  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  No acute infarct.  Opacification right sphenoid sinus.  Review of Systems: Patient complains of symptoms per HPI as well as the following symptoms: blurred vision, eye pain, easy bruising, cough, feeling cold, memory loss, headache, numbness, insomnia, anxiety, decreased energy, disinterest in activities, =. Pertinent negatives per HPI. All others negative.   Social History   Social History  . Marital status: Single    Spouse name: N/A  . Number of children: 6  . Years of education: 6   Occupational History  . Not on file.    Social History Main Topics  . Smoking status: Former Smoker    Packs/day: 0.25    Years: 1.00    Quit date: 10/18/2015  . Smokeless tobacco: Never Used  . Alcohol use No  . Drug use: No  . Sexual activity: Not Currently   Other Topics Concern  . Not on file   Social History Narrative   Lives in apartments with helper.    Caffeine use: soda: rare   Tea: 1 per day    Family History  Problem Relation Age of Onset  . Hypertension Maternal Grandmother   . Dementia Neg Hx     Past Medical History:  Diagnosis Date  . Headache   . High cholesterol     Past Surgical History:  Procedure Laterality Date  . TUMOR REMOVAL Right   . VAGINAL HYSTERECTOMY      No current outpatient prescriptions on file.   No current facility-administered medications for this visit.     Allergies as of 08/07/2016  . 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 The left pupil is round, and reactive to light.Left eye visual fields are full to finger confrontation. Extraocular movements are intact with,mild right impaired abduction. Trigeminal sensation is impaired on the left and the muscles of mastication are normal(confirmed left). Right ptosis. . The palate elevates in the midline. Hearing intact. Voice is normal. Shoulder shrug is normal. The tongue has normal motion without fasciculations.   Coordination: slowed but intact with upper extremities and left leg. Cannot perform right leg due to weakness.   Gait:: Attempted    Tone: Increased tone right , difficult for her to relax.  Posture:   Strength: right  arm 4+/5 biceps and triceps, right hip flexion 2/5, right leg flexion 3/5, right DF 3/5 otherwise intact   Sensation: right hemisensory loss  Reflex Exam:  DTR's: Deep tendon reflexes in the upper and lower extremities are brisk bilaterally.  Toes: The toes are upgoing bilaterally.  Clonus: Clonus on the right AJ 7 beats   Gait: in wheelchair      Assessment/Plan:55 year old female here for evaluation of memory changes, increased difficulty walking, multiple somatic and other neurologic complaints. She also has depression and Anxiety, untreated. Some  inconsistent complaints and clinical findings.   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 Patient reports headaches not increased, only 2 in the last 3 months.  - Depression and Anxiety, untreated: Needs follow up with primary care.  -- Memory loss: Unlikely dementia at this age, also memory changes are not progressive. May be sequelae from intracranial surgery or depression. B12, HIV, RPR were normal - Depression and anxiety may be a factor in her multiple somatic and neurologic complaints. . F/u with pcp.  - Follow up 6-27months   Sarina Ill, MD  [facility] [address] [place], [place] [address]  Phone [phone] Fax [phone]  A total of 15 minutes was spent in with this patient, son and interpreter. Over half this time was spent on counseling patient face to face on the fibrous dysplasia diagnosis and different therapeutic options available.

## 2016-09-20 ENCOUNTER — Ambulatory Visit: Payer: Medicare Other | Admitting: Rheumatology

## 2016-10-24 IMAGING — MR MR HEAD W/O CM
6 of 10 series · 20 of 48 positions shown · non-contrast
Comparison: None.

CLINICAL DATA: 54-year-old female with left-sided numbness and
increasing headaches for the past 3 days. Headaches focused on right
side near right eye. History of benign tumor removal behind eye 5331
with follow-up surgeries for removal cysts 0117 and 3447. Initial
encounter.

EXAM:
MRI HEAD WITHOUT CONTRAST
TECHNIQUE: Multiplanar, multiecho pulse sequences of the brain and surrounding
structures were obtained without intravenous contrast.

[Series 2: T1 · sagittal · 5.0mm · 0.38mm/px · 2 of 21 slices shown (1 of 2)]
[im 1/21]
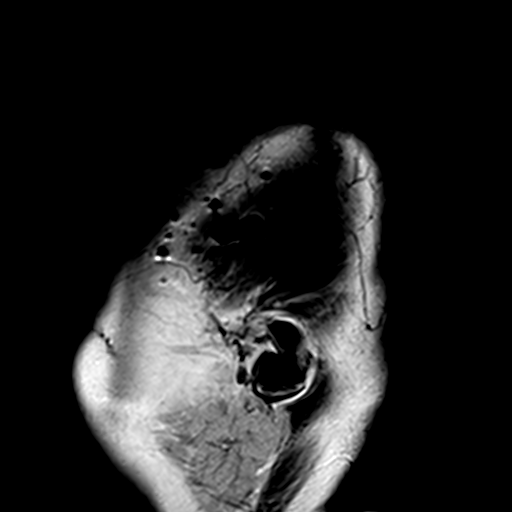
[im 21/21]
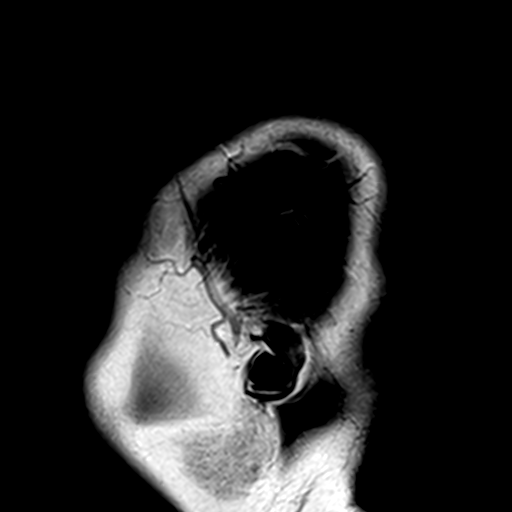

[Series 5: T2 · axial · 5.0mm · 0.46mm/px · z∈[-32,+111]mm · 3 of 23 slices shown (1 of 2)]
[im 1/23]
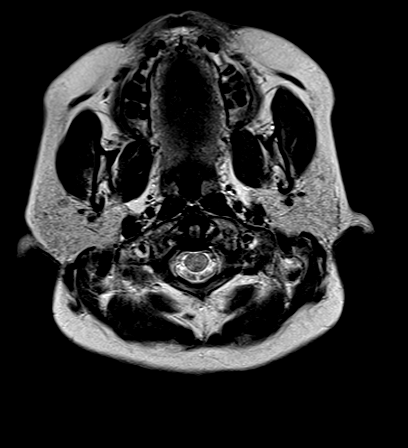
[im 12/23]
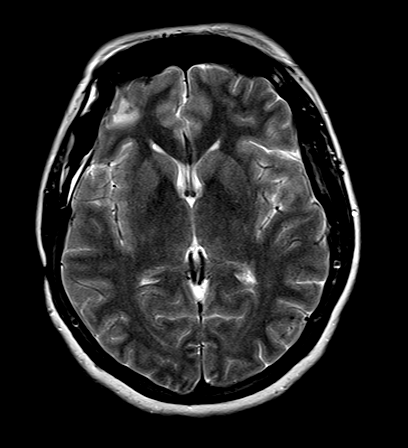
[im 23/23]
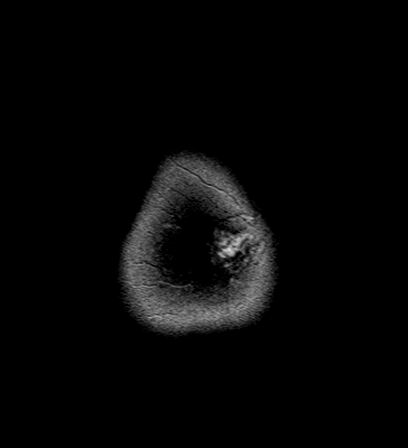

[Series 6: FLAIR · axial · 5.0mm · 0.29mm/px · z∈[-32,+111]mm · 3 of 23 slices shown]
[im 1/23]
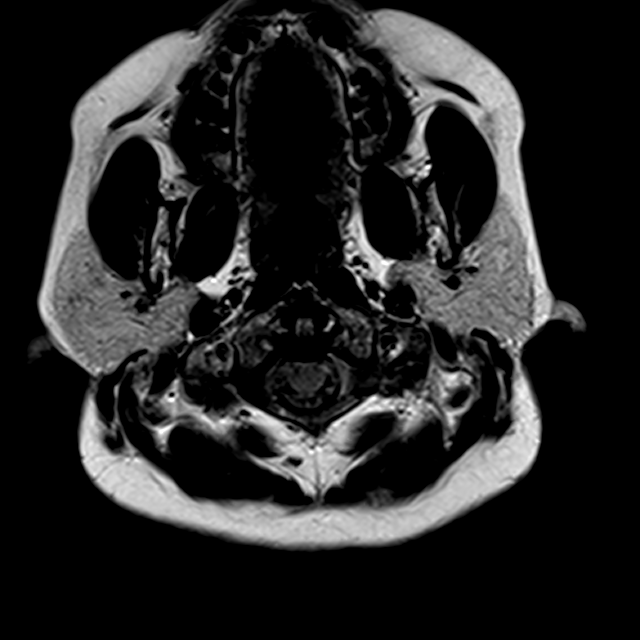
[im 12/23]
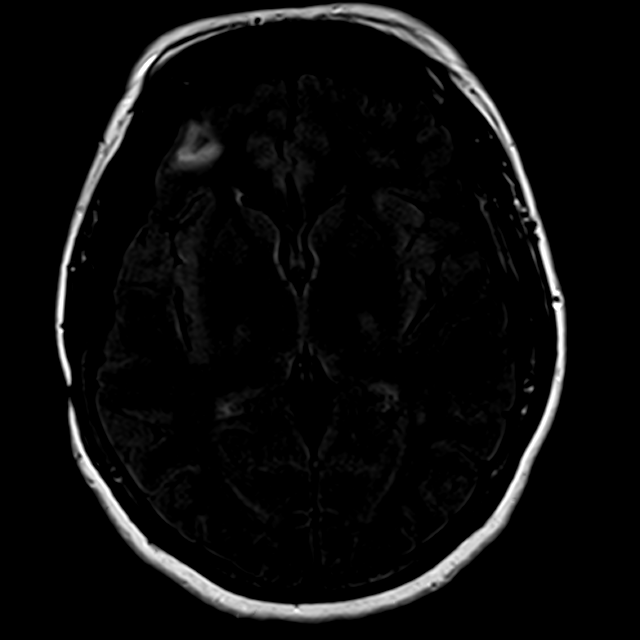
[im 23/23]
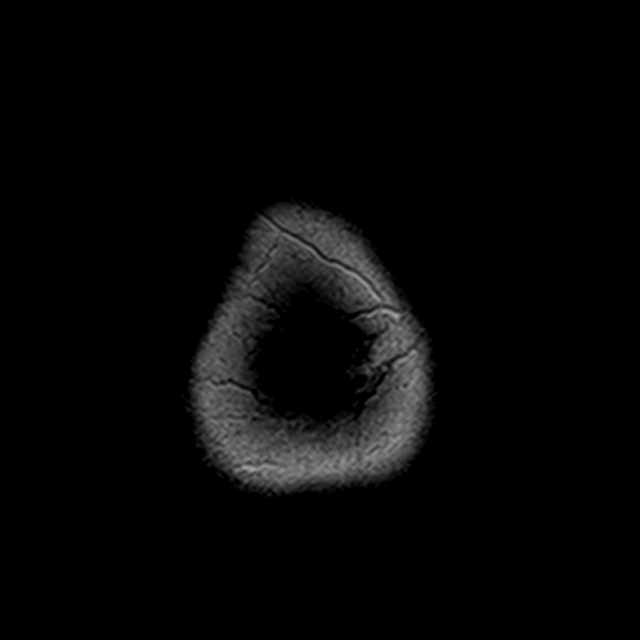

[Series 7: T1 · axial · 2.0mm · 0.38mm/px · z∈[-31,+114]mm · 8 of 74 slices shown (2 of 2)]
[im 1/74]
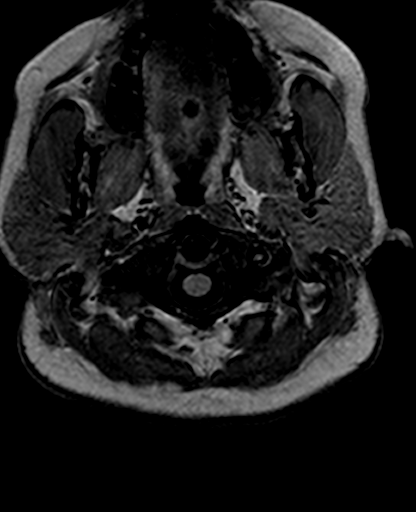
[im 10/74]
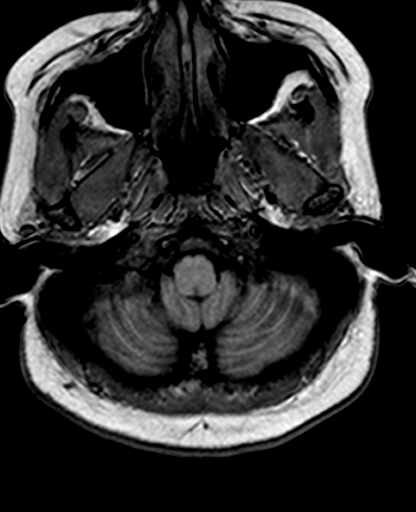
[im 19/74]
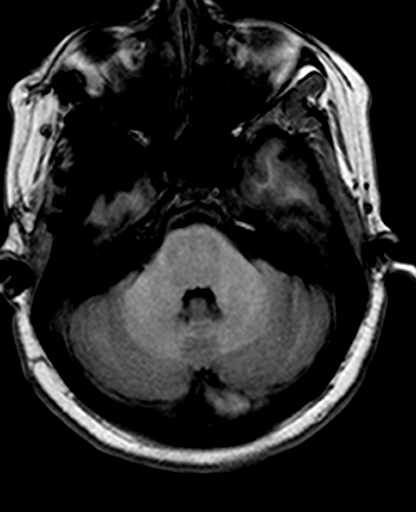
[im 28/74]
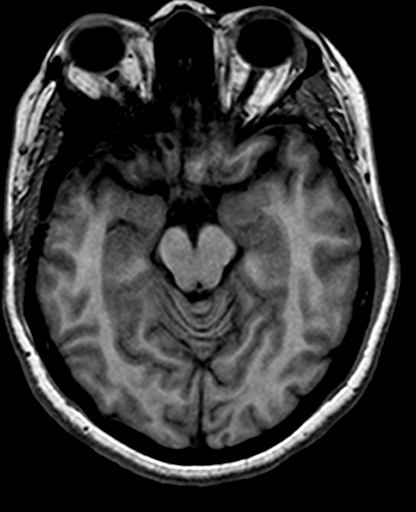
[im 46/74]
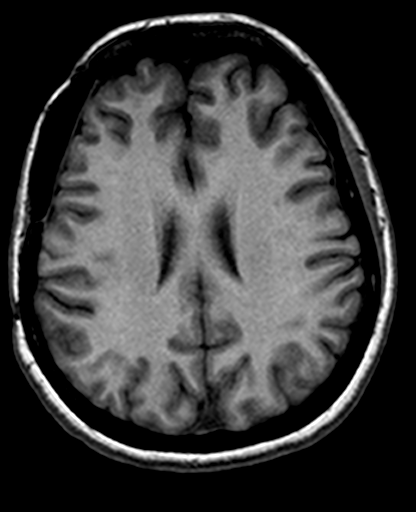
[im 55/74]
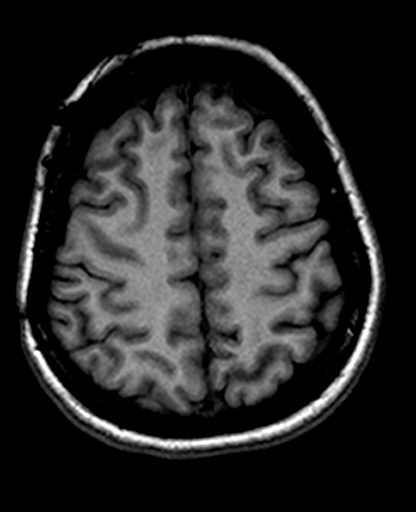
[im 64/74]
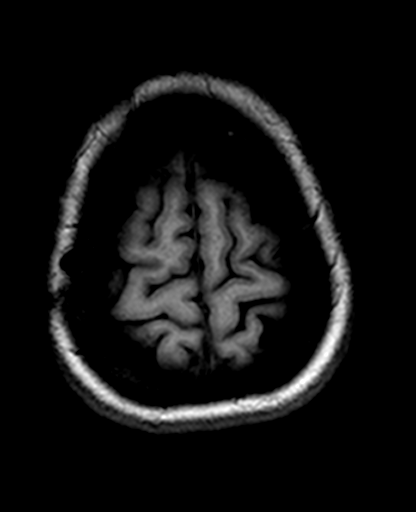
[im 74/74]
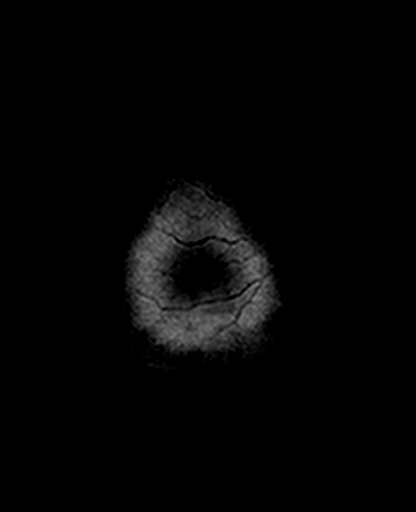

[Series 8: trauma axial · axial · 5.0mm · 0.36mm/px · 1 of 23 slices shown]
[im 1/23]
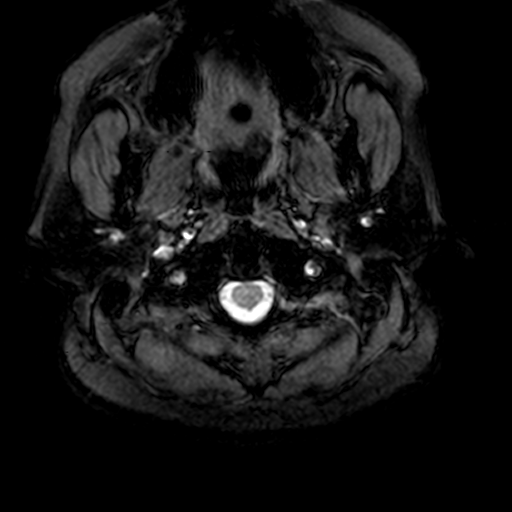

[Series 9: T2 · coronal · 5.0mm · 0.43mm/px · 3 of 24 slices shown (2 of 2)]
[im 1/24]
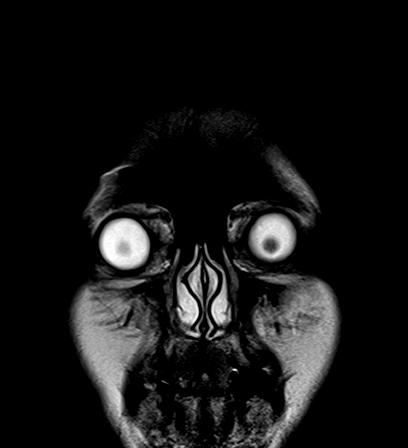
[im 12/24]
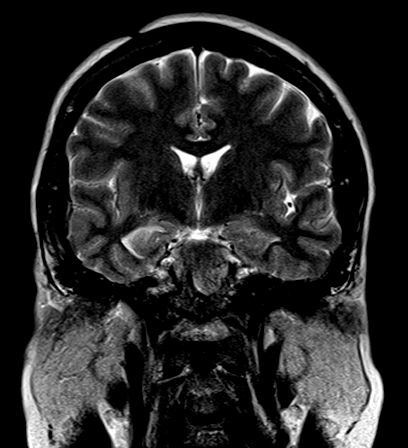
[im 24/24]
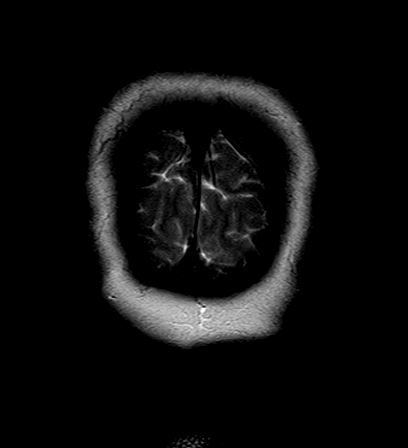

[20 of 48 positions shown; findings below may reference images not displayed]

FINDINGS: No acute infarct.

Postsurgical changes of the greater wing of the right sphenoid,
lateral wall of the right orbit and right temporal bone. Question if
surgery was for resection of fibrous dysplasia? Currently, bony
expansion of the greater wing of the right sphenoid and lateral wall
of right orbit contributes to inward displacement of the right
lateral rectus muscle and narrowing of the right orbital apex. This
may compromise the right optic nerve.

Encephalomalacia anterior right temporal lobe and anterior inferior
right frontal lobe.

Small fluid collection surrounds the graft placement in the right
temporal craniotomy site. Slight flattening of adjacent brain
parenchyma.

No intracranial hemorrhage.

No hydrocephalus.

Major intracranial vascular structures are patent.

Opacification right sphenoid sinus.
IMPRESSION: Postsurgical changes of the greater wing of the right sphenoid,
lateral wall of the right orbit and right temporal bone. Question if
surgery was for resection of fibrous dysplasia? Currently, bony
expansion of the greater wing of the right sphenoid and lateral wall
of right orbit contributes to inward displacement of the right
lateral rectus muscle and narrowing of the right orbital apex. This
may compromise the right optic nerve. Patient would benefit from
dedicated orbital CT in addition to obtaining any prior exams for
comparison.

Encephalomalacia anterior right temporal lobe and anterior inferior
right frontal lobe.

If the patient had surgery for tumor other than fibrous dysplasia
then contrast enhanced MR Nakpil be considered to determine if there is
epidural extension of this process after CT has been obtained.

No acute infarct.

Opacification right sphenoid sinus.

## 2016-11-08 NOTE — Progress Notes (Deleted)
   Office Visit Note  Patient: Jamie Obrien             Date of Birth: March 20, 1961           MRN: [id number]             PCP: No primary care provider on file. Referring: No ref. provider found Visit Date: 11/13/2016 Occupation: '@GUAROCC'$ @    Subjective:  No chief complaint on file.   History of Present Illness: Jamie Obrien is a 56 y.o. female ***   Activities of Daily Living:  Patient reports morning stiffness for *** {minute/hour:19697}.   Patient {ACTIONS;DENIES/REPORTS:21021675::"Denies"} nocturnal pain.  Difficulty dressing/grooming: {ACTIONS;DENIES/REPORTS:21021675::"Denies"} Difficulty climbing stairs: {ACTIONS;DENIES/REPORTS:21021675::"Denies"} Difficulty getting out of chair: {ACTIONS;DENIES/REPORTS:21021675::"Denies"} Difficulty using hands for taps, buttons, cutlery, and/or writing: {ACTIONS;DENIES/REPORTS:21021675::"Denies"}   No Rheumatology ROS completed.   PMFS History:  Patient Active Problem List   Diagnosis Date Noted  . 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 Chronic daily headache 10/25/2015  . Morning headache 10/25/2015  . Depression 10/25/2015  . Generalized anxiety disorder 10/25/2015  . Memory loss of unknown cause 10/25/2015  . HLD (hyperlipidemia) 10/25/2015    Past Medical History:  Diagnosis Date  . Headache   . High cholesterol     Family History  Problem Relation Age of Onset  . Hypertension Maternal Grandmother   . Dementia Neg Hx    Past Surgical History:  Procedure Laterality Date  . TUMOR REMOVAL Right   . VAGINAL HYSTERECTOMY     Social History   Social History Narrative   Lives in apartments with helper.    Caffeine use: soda: rare   Tea: 1 per day     Objective: Vital Signs: There were no vitals taken for this visit.   Physical Exam   Musculoskeletal Exam: ***  CDAI Exam: No CDAI exam completed.    Investigation: Findings:  02/05/2016 CBC normal, CMP alkaline phosphatase 147, ESR 29 normal,  uric acid 5.5, ANA positive,, ENA RNP +1.6(DS, Smith, SCL 70, SSA, SSB, anti-chromatin, Jo 1, centromere negative) RF negative, vitamin D 11.9    Imaging: No results found.  Speciality Comments: No specialty comments available.    Procedures:  No procedures performed Allergies: Patient has no known allergies.   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  No orders of the defined types were placed in this encounter.   Face-to-face time spent with patient was *** minutes. 50% of time was spent in counseling and coordination of care.  Follow-Up Instructions: No Follow-up on file.   Bo Merino, MD  Note - This record has been created using Editor, commissioning.  Chart creation errors have been sought, but may not always  have been located. Such creation errors do not reflect on  the standard of medical care.

## 2016-11-13 ENCOUNTER — Ambulatory Visit: Payer: Medicare Other | Admitting: Rheumatology

## 2017-04-20 ENCOUNTER — Encounter: Payer: Self-pay | Admitting: Adult Health

## 2017-05-07 ENCOUNTER — Ambulatory Visit: Payer: Medicare Other | Admitting: Adult Health

## 2017-09-24 DIAGNOSIS — I10 Essential (primary) hypertension: Secondary | ICD-10-CM | POA: Diagnosis not present

## 2017-09-24 DIAGNOSIS — Z0000 Encounter for general adult medical examination without abnormal findings: Secondary | ICD-10-CM | POA: Diagnosis not present

## 2017-09-24 DIAGNOSIS — R413 Other amnesia: Secondary | ICD-10-CM | POA: Diagnosis not present

## 2017-09-24 DIAGNOSIS — D496 Neoplasm of unspecified behavior of brain: Secondary | ICD-10-CM | POA: Diagnosis not present

## 2017-09-24 DIAGNOSIS — R2689 Other abnormalities of gait and mobility: Secondary | ICD-10-CM | POA: Diagnosis not present

## 2017-09-24 DIAGNOSIS — R411 Anterograde amnesia: Secondary | ICD-10-CM | POA: Diagnosis not present

## 2017-10-01 DIAGNOSIS — Z1212 Encounter for screening for malignant neoplasm of rectum: Secondary | ICD-10-CM | POA: Diagnosis not present

## 2017-10-01 DIAGNOSIS — Z1211 Encounter for screening for malignant neoplasm of colon: Secondary | ICD-10-CM | POA: Diagnosis not present

## 2017-11-12 DIAGNOSIS — I10 Essential (primary) hypertension: Secondary | ICD-10-CM | POA: Diagnosis not present

## 2017-11-12 DIAGNOSIS — R748 Abnormal levels of other serum enzymes: Secondary | ICD-10-CM | POA: Diagnosis not present

## 2017-11-12 DIAGNOSIS — K713 Toxic liver disease with chronic persistent hepatitis: Secondary | ICD-10-CM | POA: Diagnosis not present

## 2017-11-16 DIAGNOSIS — K8020 Calculus of gallbladder without cholecystitis without obstruction: Secondary | ICD-10-CM | POA: Diagnosis not present

## 2017-11-16 DIAGNOSIS — R748 Abnormal levels of other serum enzymes: Secondary | ICD-10-CM | POA: Diagnosis not present

## 2017-11-26 ENCOUNTER — Telehealth: Payer: Self-pay | Admitting: *Deleted

## 2017-11-26 ENCOUNTER — Institutional Professional Consult (permissible substitution): Payer: Medicare Other | Admitting: Neurology

## 2017-11-26 NOTE — Telephone Encounter (Signed)
Pt no showed new pt appt on 11/26/2017 @ 2:00.

## 2017-11-27 ENCOUNTER — Encounter: Payer: Self-pay | Admitting: Neurology

## 2018-01-28 ENCOUNTER — Ambulatory Visit: Payer: Medicare Other | Admitting: Neurology

## 2018-01-28 ENCOUNTER — Encounter

## 2018-01-28 ENCOUNTER — Ambulatory Visit: Payer: Self-pay | Admitting: Nurse Practitioner

## 2018-08-12 DIAGNOSIS — L509 Urticaria, unspecified: Secondary | ICD-10-CM | POA: Diagnosis not present

## 2018-09-27 ENCOUNTER — Encounter: Payer: Self-pay | Admitting: Allergy & Immunology

## 2018-09-27 ENCOUNTER — Ambulatory Visit (INDEPENDENT_AMBULATORY_CARE_PROVIDER_SITE_OTHER): Payer: Medicare Other | Admitting: Allergy & Immunology

## 2018-09-27 VITALS — BP 154/82 | HR 88 | Temp 98.0°F | Resp 16 | Ht 60.0 in | Wt 151.2 lb

## 2018-09-27 DIAGNOSIS — R1310 Dysphagia, unspecified: Secondary | ICD-10-CM | POA: Diagnosis not present

## 2018-09-27 DIAGNOSIS — L508 Other urticaria: Secondary | ICD-10-CM

## 2018-09-27 MED ORDER — PREDNISONE 10 MG PO TABS: ORAL_TABLET | ORAL | 0 refills | Status: DC

## 2018-09-27 MED ORDER — METHYLPREDNISOLONE ACETATE 80 MG/ML IJ SUSP
80.0000 mg | Freq: Once | INTRAMUSCULAR | Status: AC
  Administered 2018-09-27: 80 mg via INTRAMUSCULAR

## 2018-09-27 MED ORDER — CETIRIZINE HCL 10 MG PO TABS: 10.0000 mg | ORAL_TABLET | Freq: Every day | ORAL | 5 refills | Status: DC

## 2018-09-27 MED ORDER — FAMOTIDINE 20 MG PO TABS: 20.0000 mg | ORAL_TABLET | Freq: Two times a day (BID) | ORAL | 5 refills | Status: DC

## 2018-09-27 NOTE — Progress Notes (Signed)
NEW PATIENT  Date of Service/Encounter:  09/27/18  Referring provider: Lanelle Bal, PA-C   Assessment:   Chronic urticaria  Dysphagia  Plan/Recommendations:   1. Chronic urticaria - Your history does not have any "red flags" such as fevers, joint pains, or permanent skin changes that would be concerning for a more serious cause of hives.  - We will get some labs to rule out serious causes of hives: complete blood count, tryptase level, chronic urticaria panel, thyroid antibodies, alpha gal panel, ANA, CMP, ESR, CRP. - Chronic hives are often times a self limited process and will "burn themselves out" over 6-12 months, although this is not always the case.  - DepoMedrol steroid injection given today. - Start the prednisone taper sent into the pharmacy.  - In the meantime, start suppressive dosing of antihistamines:   - Morning: Zyrtec (cetirizine) 10-54m (one to two tablets) + Pepcid (famotidine) 244m(one tablet)  - Evening: Zyrtec (cetirizine) 10-20100mone to two tablets) + Pepcid (famotidine) 8m73mne tablet) + Singulair (montelukast) 10mg6mYou can change this dosing at home, decreasing the dose as needed or increasing the dosing as needed.  - If you are not tolerating the medications or are tired of taking them every day, we can start treatment with a monthly injectable medication called Xolair.   2. Return in about 6 weeks (around 11/08/2018).  Subjective:   Jamie Obrien 57 y.69 female presenting today for evaluation of  Chief Complaint  Patient presents with  . Urticaria    Jamie Logana history of the following: Patient Active Problem List   Diagnosis Date Noted  . 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 Chronic daily headache 10/25/2015  . Morning headache 10/25/2015  . Depression 10/25/2015  . Generalized anxiety disorder 10/25/2015  . Memory loss of unknown cause 10/25/2015  . HLD (hyperlipidemia) 10/25/2015    History  obtained from: chart review and patient via an interpreter.  Jamie Loganreferred by CarroLanelle BalC.     Jamie Obrien 57 y.73 female presenting for an evaluation of urticaria. She reports at first that she had hives since September 2019. However, it seems after talking to her that this has been going on for years. These lesions emerge over her entire body. They leave normal skin when they clear up. She has had some periorbital edema with these. She does have some joint pain associated with these. She has been treated with prednisone tapers, which do help a lot, but they remerged when the prednisone taper is over.   She has never tried using any antihistamines. She has avoided certain chemicals including deodorant. She has tried changing her soaps and detergents. She denies any new medications during this entire time. She has not noticed that these are associated with any foods. She typically eats the same things including beans, chicken, and vegetables. She does not eat red meta at all.   She does report some intermittent abdominal pain and dysphagia with these symptoms. She has not been on a PPI or H2 blocker. Evidently she has undergone a rheumatologic workup which was normal. She does not remember the name of the Rheumatologist.   She has no history of asthma or food allergies. She does have a history of a tumor which was removed from her right eye.   Otherwise, there is no history of other atopic diseases, including asthma, food allergies, drug allergies, stinging insect allergies or eczema. There  is no significant infectious history. Vaccinations are up to date.    Past Medical History: Patient Active Problem List   Diagnosis Date Noted  . 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 Chronic daily headache 10/25/2015  . Morning headache 10/25/2015  . Depression 10/25/2015  . Generalized anxiety disorder 10/25/2015  . Memory loss of unknown cause 10/25/2015  . HLD  (hyperlipidemia) 10/25/2015    Medication List:  Allergies as of 09/27/2018   No Known Allergies     Medication List       Accurate as of September 27, 2018 11:59 PM. Always use your most recent med list.        cetirizine 10 MG tablet Commonly known as:  ZYRTEC Take 1 tablet (10 mg total) by mouth daily.   famotidine 20 MG tablet Commonly known as:  PEPCID Take 1 tablet (20 mg total) by mouth 2 (two) times daily.   predniSONE 10 MG tablet Commonly known as:  DELTASONE Take 3 tabs (69m) twice daily for 3 days, then 2 tabs (282m twice daily for 3 days, then 1 tab (1036mtwice daily for 3 days, then STOP.       Birth History: non-contributory  Developmental History: non-contributory.   Past Surgical History: Past Surgical History:  Procedure Laterality Date  . TUMOR REMOVAL Right   . VAGINAL HYSTERECTOMY       Family History: Family History  Problem Relation Age of Onset  . Hypertension Maternal Grandmother   . Dementia Neg Hx      Social History: [place] at home with her family. She lives in an apartment with carpeting throughout. There is electric heating and central cooling. There are cats outside of the home. There are no dust mite coverings on the bedding. There is no smoking exposure in the home. There is no smoking exposure at all.      Review of Systems: a 14-point review of systems is pertinent for what is mentioned in HPI.  Otherwise, all other systems were negative. Constitutional: negative other than that listed in the HPI Eyes: negative other than that listed in the HPI Ears, nose, mouth, throat, and face: negative other than that listed in the HPI Respiratory: negative other than that listed in the HPI Cardiovascular: negative other than that listed in the HPI Gastrointestinal: negative other than that listed in the HPI Genitourinary: negative other than that listed in the HPI Integument: negative other than that listed in the  HPI Hematologic: negative other than that listed in the HPI Musculoskeletal: negative other than that listed in the HPI Neurological: negative other than that listed in the HPI Allergy/Immunologic: negative other than that listed in the HPI    Objective:   Blood pressure (!) 154/82, pulse 88, temperature 98 F (36.7 C), temperature source Oral, resp. rate 16, height 5' (1.524 m), weight 151 lb 3.2 oz (68.6 kg), SpO2 98 %. Body mass index is 29.53 kg/m.   Physical Exam:  General: Alert, interactive, in mild distress.  Eyes: No conjunctival injection bilaterally, no discharge on the right, no discharge on the left and no Horner-Trantas dots present. PERRL bilaterally. EOMI without pain. No photophobia.  Ears: Right TM pearly gray with normal light reflex, Left TM pearly gray with normal light reflex, Right TM intact without perforation and Left TM intact without perforation.  Nose/Throat: External nose within normal limits and septum midline. Turbinates edematous and pale with clear discharge. Posterior oropharynx erythematous with cobblestoning in the posterior oropharynx. Tonsils 2+  without exudates.  Tongue without thrush. Neck: Supple without thyromegaly. Trachea midline. Adenopathy: no enlarged lymph nodes appreciated in the anterior cervical, occipital, axillary, epitrochlear, inguinal, or popliteal regions. Lungs: Clear to auscultation without wheezing, rhonchi or rales. No increased work of breathing. CV: Normal S1/S2. No murmurs. Capillary refill <2 seconds.  Abdomen: Nondistended, nontender. No guarding or rebound tenderness. Bowel sounds present in all fields and hypoactive  Skin: Warm and dry, without lesions or rashes. Extremities:  No clubbing, cyanosis or edema. Neuro:   Grossly intact. No focal deficits appreciated. Responsive to questions.  Diagnostic studies: none        Salvatore Marvel, MD [facility]

## 2018-09-27 NOTE — Patient Instructions (Addendum)
1. Chronic urticaria - Your history does not have any "red flags" such as fevers, joint pains, or permanent skin changes that would be concerning for a more serious cause of hives.  - We will get some labs to rule out serious causes of hives: complete blood count, tryptase level, chronic urticaria panel, thyroid antibodies, alpha gal panel, ANA, CMP, ESR, CRP. - Chronic hives are often times a self limited process and will "burn themselves out" over 6-12 months, although this is not always the case.  - DepoMedrol steroid injection given today. - Start the prednisone taper sent into the pharmacy.  - In the meantime, start suppressive dosing of antihistamines:   - Morning: Zyrtec (cetirizine) 10-42m (one to two tablets) + Pepcid (famotidine) 239m(one tablet)  - Evening: Zyrtec (cetirizine) 10-2076mone to two tablets) + Pepcid (famotidine) 5m84mne tablet) + Singulair (montelukast) 10mg58mYou can change this dosing at home, decreasing the dose as needed or increasing the dosing as needed.  - If you are not tolerating the medications or are tired of taking them every day, we can start treatment with a monthly injectable medication called Xolair.   2. Return in about 6 weeks (around 11/08/2018).   Please inform us of[place] Emergency Department visits, hospitalizations, or changes in symptoms. Call us be[place]are going to the ED for breathing or allergy symptoms since we might be able to fit you in for a sick visit. Feel free to contact us an[place]ime with any questions, problems, or concerns.  It was a pleasure to meet you today!  Websites that have reliable patient information: 1. American Academy of Asthma, Allergy, and Immunology: www.aaaai.org 2. Food Allergy Research and Education (FARE): foodallergy.org 3. Mothers of Asthmatics: http://www.asthmacommunitynetwork.org 4. American College of Allergy, Asthma, and Immunology: www.aMonthlyElectricBill.co.ukke sure you are registered to vote! If you have moved or  changed any of your contact information, you will need to get this updated before voting!    Voter ID laws are POSSIBLY going into effect for the General Election in November 2020! Be prepared! Check out [email or web address]://levine.com/more details.

## 2018-09-28 ENCOUNTER — Encounter: Payer: Self-pay | Admitting: Allergy & Immunology

## 2018-09-28 DIAGNOSIS — R1310 Dysphagia, unspecified: Secondary | ICD-10-CM | POA: Insufficient documentation

## 2018-09-28 DIAGNOSIS — L508 Other urticaria: Secondary | ICD-10-CM | POA: Insufficient documentation

## 2018-09-30 DIAGNOSIS — L508 Other urticaria: Secondary | ICD-10-CM | POA: Diagnosis not present

## 2018-09-30 DIAGNOSIS — R1310 Dysphagia, unspecified: Secondary | ICD-10-CM | POA: Diagnosis not present

## 2018-10-02 LAB — H. PYLORI BREATH TEST: H PYLORI BREATH TEST: POSITIVE — AB

## 2018-10-04 LAB — IGE+ALLERGENS ZONE 2(30)
Alternaria Alternata IgE: <0.1 kU/L
Bermuda Grass IgE: <0.1 kU/L
Cedar, Mountain IgE: 0.1 kU/L — AB
Cockroach, American IgE: <0.1 kU/L
Common Silver Birch IgE: <0.1 kU/L
D002-IGE D FARINAE: 0.18 kU/L — AB
Dog Dander IgE: <0.1 kU/L
Hickory, White IgE: <0.1 kU/L
IgE (Immunoglobulin E), Serum: 291 IU/mL (ref 6–495)
Maple/Box Elder IgE: 0.1 kU/L — AB
Mucor Racemosus IgE: <0.1 kU/L
Mugwort IgE Qn: <0.1 kU/L
Nettle IgE: <0.1 kU/L
Oak, White IgE: <0.1 kU/L
Penicillium Chrysogen IgE: <0.1 kU/L
Plantain, English IgE: 0.13 kU/L — AB
Ragweed, Short IgE: <0.1 kU/L
Sheep Sorrel IgE Qn: <0.1 kU/L
Stemphylium Herbarum IgE: <0.1 kU/L
Sweet gum IgE RAST Ql: <0.1 kU/L
T008-IGE ELM, AMERICAN: 0.1 kU/L — AB
Timothy Grass IgE: 0.13 kU/L — AB
W014-IGE PIGWEED, ROUGH: 0.12 kU/L — AB

## 2018-10-04 LAB — ANA W/REFLEX IF POSITIVE
Anti JO-1: <0.2 AI (ref 0.0–0.9)
Anti Nuclear Antibody(ANA): POSITIVE — AB
Centromere Ab Screen: <0.2 AI (ref 0.0–0.9)
Chromatin Ab SerPl-aCnc: <0.2 AI (ref 0.0–0.9)
ENA RNP Ab: 1.2 AI — ABNORMAL HIGH (ref 0.0–0.9)
ENA SM Ab Ser-aCnc: <0.2 AI (ref 0.0–0.9)
ENA SSB (LA) Ab: <0.2 AI (ref 0.0–0.9)
Scleroderma SCL-70: <0.2 AI (ref 0.0–0.9)
dsDNA Ab: <1 IU/mL (ref 0–9)

## 2018-10-04 LAB — CMP14+EGFR
ALK PHOS: 164 IU/L — AB (ref 39–117)
ALT: 39 IU/L — ABNORMAL HIGH (ref 0–32)
AST: 26 IU/L (ref 0–40)
Albumin/Globulin Ratio: 1.8 (ref 1.2–2.2)
Albumin: 5 g/dL — ABNORMAL HIGH (ref 3.8–4.9)
BILIRUBIN TOTAL: 0.5 mg/dL (ref 0.0–1.2)
BUN/Creatinine Ratio: 16 (ref 9–23)
BUN: 11 mg/dL (ref 6–24)
CO2: 22 mmol/L (ref 20–29)
Calcium: 10.1 mg/dL (ref 8.7–10.2)
Chloride: 103 mmol/L (ref 96–106)
Creatinine, Ser: 0.7 mg/dL (ref 0.57–1.00)
GFR calc Af Amer: 111 mL/min/{1.73_m2} (ref >59)
GFR calc non Af Amer: 96 mL/min/{1.73_m2} (ref >59)
GLOBULIN, TOTAL: 2.8 g/dL (ref 1.5–4.5)
GLUCOSE: 76 mg/dL (ref 65–99)
POTASSIUM: 4.5 mmol/L (ref 3.5–5.2)
Sodium: 142 mmol/L (ref 134–144)
Total Protein: 7.8 g/dL (ref 6.0–8.5)

## 2018-10-04 LAB — C-REACTIVE PROTEIN: CRP: 3 mg/L (ref 0–10)

## 2018-10-04 LAB — ALPHA-GAL PANEL
Alpha Gal IgE*: <0.1 kU/L (ref <0.10)
BEEF CLASS INTERPRETATION: 0
Class Interpretation: 0
Class Interpretation: 0
Lamb/Mutton (Ovis spp) IgE: <0.1 kU/L (ref <0.35)
Pork (Sus spp) IgE: <0.1 kU/L (ref <0.35)

## 2018-10-04 LAB — THYROID ANTIBODIES: Thyroperoxidase Ab SerPl-aCnc: 19 IU/mL (ref 0–34)

## 2018-10-04 LAB — SEDIMENTATION RATE: Sed Rate: 28 mm/hr (ref 0–40)

## 2018-10-04 LAB — CHRONIC URTICARIA: cu index: <5 (ref <10)

## 2018-10-04 LAB — TRYPTASE: TRYPTASE: 4.3 ug/L (ref 2.2–13.2)

## 2018-10-08 MED ORDER — CLARITHROMYCIN 500 MG PO TABS: 500.0000 mg | ORAL_TABLET | Freq: Two times a day (BID) | ORAL | 0 refills | Status: AC

## 2018-10-08 MED ORDER — PANTOPRAZOLE SODIUM 40 MG PO TBEC: 40.0000 mg | DELAYED_RELEASE_TABLET | Freq: Every day | ORAL | 0 refills | Status: DC

## 2018-10-08 MED ORDER — AMOXICILLIN 500 MG PO TABS: 1000.0000 mg | ORAL_TABLET | Freq: Two times a day (BID) | ORAL | 0 refills | Status: AC

## 2018-10-08 NOTE — Addendum Note (Signed)
Addended by: Valentina Shaggy on: 10/08/2018 02:38 PM   Modules accepted: Orders

## 2018-10-08 NOTE — Progress Notes (Signed)
Triple therapy for H. pylori treatment sent into the pharmacy.  Salvatore Marvel, MD [facility]

## 2018-10-09 ENCOUNTER — Telehealth: Payer: Self-pay

## 2018-10-09 NOTE — Telephone Encounter (Signed)
error 

## 2018-11-18 ENCOUNTER — Ambulatory Visit: Payer: Medicare Other | Admitting: Allergy

## 2018-12-16 ENCOUNTER — Ambulatory Visit: Payer: Medicare Other | Admitting: Allergy

## 2018-12-20 ENCOUNTER — Ambulatory Visit (INDEPENDENT_AMBULATORY_CARE_PROVIDER_SITE_OTHER): Payer: Medicare Other | Admitting: Allergy & Immunology

## 2018-12-20 ENCOUNTER — Encounter: Payer: Self-pay | Admitting: Allergy & Immunology

## 2018-12-20 ENCOUNTER — Other Ambulatory Visit: Payer: Self-pay

## 2018-12-20 ENCOUNTER — Telehealth: Payer: Self-pay

## 2018-12-20 VITALS — BP 132/84 | HR 75 | Temp 98.2°F | Resp 16

## 2018-12-20 DIAGNOSIS — R1310 Dysphagia, unspecified: Secondary | ICD-10-CM

## 2018-12-20 DIAGNOSIS — L508 Other urticaria: Secondary | ICD-10-CM

## 2018-12-20 NOTE — Patient Instructions (Addendum)
1. Chronic urticaria - Try decreasing down to one Zyrtec (cetirizine) once daily.  - Let us know how it works.   2. Abdominal pain - We are going to refer you to Dr. Oneida Alar here in [place]. - Hopefully they will call you sometime next week.  - She may want to do more testing to make sure that you are cured of your Heliobacter infection.   3. Follow up as needed.    Please inform us of any Emergency Department visits, hospitalizations, or changes in symptoms. Call us before going to the ED for breathing or allergy symptoms since we might be able to fit you in for a sick visit. Feel free to contact us anytime with any questions, problems, or concerns.  It was a pleasure to see you and your family again today!  Websites that have reliable patient information: 1. American Academy of Asthma, Allergy, and Immunology: www.aaaai.org 2. Food Allergy Research and Education (FARE): foodallergy.org 3. Mothers of Asthmatics: http://www.asthmacommunitynetwork.org 4. American College of Allergy, Asthma, and Immunology: www.acaai.org  "Like" [place] on Facebook and Instagram for our latest updates!      Make sure you are registered to vote! If you have moved or changed any of your contact information, you will need to get this updated before voting!    Voter ID laws are NOT going into effect for the General Election in November 2020! DO NOT let this stop you from exercising your right to vote!

## 2018-12-20 NOTE — Telephone Encounter (Signed)
Referral has been placed for Abdominal Pain to Dr Oneida Alar office.   Thanks

## 2018-12-20 NOTE — Progress Notes (Signed)
FOLLOW UP  Date of Service/Encounter:  12/20/18   Assessment:   Chronic urticaria - resolved  Dysphagia   Previous H pylori infection - s/p treatment with triple therapy   [name] presents for follow-up visit.  She did complete her triple therapy for her Heliobacter pylori infection.  She does feel better from an urticarial perspective.  She is on cetirizine and famotidine twice daily which is keeping all of her rashes and itching at bay.  She has not tried decreasing this dosing at home.  The rest of her lab work-up was essentially normal aside from some mild sensitizations to a couple of environmental allergens.  However, she is endorsing continued dyspepsia symptoms which are worse in the morning prior to eating breakfast.  I think at this point she needs to have an evaluation by gastroenterologist to see if she warrants an endoscopy.  We will refer her to Dr. fields here in [place].  She lives in [place] but her son lives here in [place] and her son is her main source of transportation.  I think she can follow-up as needed with Jamie Obrien, but I am happy to see her again if needed.  Plan/Recommendations:   1. Chronic urticaria - Try decreasing down to one Zyrtec (cetirizine) once daily.  - Let Jamie Obrien know how it works.   2. Abdominal pain - We are going to refer you to Dr. Oneida Alar here in [place]. - Hopefully they will call you sometime next week.  - She may want to do more testing to make sure that you are cured of your Heliobacter infection.   3. Follow up as needed.    Subjective:   Jamie Obrien is a 58 y.o. female presenting today for follow up of  Chief Complaint  Patient presents with  . Urticaria    Jamie Obrien has a history of the following: Patient Active Problem List   Diagnosis Date Noted  . Chronic urticaria 09/28/2018  . Dysphagia 09/28/2018  . 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 Chronic daily headache 10/25/2015  . Morning  headache 10/25/2015  . Depression 10/25/2015  . Generalized anxiety disorder 10/25/2015  . Memory loss of unknown cause 10/25/2015  . HLD (hyperlipidemia) 10/25/2015    History obtained from: chart review and patient and son.  Jamie Obrien is a 58 y.o. female presenting for a follow up visit.   Since the last visit, she has done well. Her hives are better. She remains on the cetirizine twice daily as well as famotidine twice daily. She has not tried decreasing this dosing at home but she is very open to it.  She did complete her triple therapy for her reflux. She does have the stomach pain mostly in the morning when she wakes up. She estimates that it lasts for a minute or so. She has never seen a gastroenterologist. The pain is worse prior to eating. Eating does tend to make it better. She has never had an endoscopy.   Otherwise, there have been no changes to her past medical history, surgical history, family history, or social history.    Review of Systems  Constitutional: Negative.  Negative for fever, malaise/fatigue and weight loss.  HENT: Negative.  Negative for congestion, ear discharge and ear pain.   Eyes: Negative for pain, discharge and redness.  Respiratory: Negative for cough, sputum production, shortness of breath and wheezing.   Cardiovascular: Negative.  Negative for chest pain and palpitations.  Gastrointestinal: Positive for nausea and vomiting.  Negative for abdominal pain and heartburn.  Skin: Negative.  Negative for itching and rash.  Neurological: Negative for dizziness and headaches.  Endo/Heme/Allergies: Negative for environmental allergies. Does not bruise/bleed easily.       Objective:   Blood pressure 132/84, pulse 75, temperature 98.2 F (36.8 C), temperature source Oral, resp. rate 16, SpO2 95 %. There is no height or weight on file to calculate BMI.   Physical Exam:  Physical Exam  Constitutional: She appears well-developed.  HENT:  Head:  Normocephalic and atraumatic.  Right Ear: Tympanic membrane, external ear and ear canal normal.  Left Ear: Tympanic membrane and ear canal normal.  Nose: No mucosal edema, rhinorrhea, nasal deformity or septal deviation. No epistaxis. Right sinus exhibits no maxillary sinus tenderness and no frontal sinus tenderness. Left sinus exhibits no maxillary sinus tenderness and no frontal sinus tenderness.  Mouth/Throat: Uvula is midline and oropharynx is clear and moist. Mucous membranes are not pale and not dry.  Eyes: Pupils are equal, round, and reactive to light. Conjunctivae and EOM are normal. Right eye exhibits no chemosis and no discharge. Left eye exhibits no chemosis and no discharge. Right conjunctiva is not injected. Left conjunctiva is not injected.  Right eye injected and appears painful (this is baseline for her).  Cardiovascular: Normal rate, regular rhythm and normal heart sounds.  Respiratory: Effort normal and breath sounds normal. No accessory muscle usage. No tachypnea. No respiratory distress. She has no wheezes. She has no rhonchi. She has no rales. She exhibits no tenderness.  Lymphadenopathy:    She has no cervical adenopathy.  Neurological: She is alert.  Skin: No abrasion, no petechiae and no rash noted. Rash is not papular, not vesicular and not urticarial. No erythema. No pallor.  No eczematous lesions noted.  No urticarial lesions noted.  Psychiatric: She has a normal mood and affect.     Diagnostic studies: none     Salvatore Marvel, MD  [facility]

## 2018-12-20 NOTE — Telephone Encounter (Signed)
Can you place a referral for GI?  Preferably Dr. Oneida Alar.   Thank you!

## 2018-12-26 ENCOUNTER — Encounter: Payer: Self-pay | Admitting: Nurse Practitioner

## 2019-02-07 DIAGNOSIS — L509 Urticaria, unspecified: Secondary | ICD-10-CM | POA: Diagnosis not present

## 2019-02-24 ENCOUNTER — Encounter: Payer: Self-pay | Admitting: Nurse Practitioner

## 2019-02-24 ENCOUNTER — Ambulatory Visit: Payer: Medicare Other | Admitting: Nurse Practitioner

## 2019-02-24 NOTE — Progress Notes (Deleted)
Primary Care Physician:  Lanelle Bal, PA-C Primary Gastroenterologist:  Dr.   Rayne Du chief complaint on file.   HPI:   Jamie Obrien is a 58 y.o. female who presents on referral from primary care for abdominal pain.  Reviewed information provided with referral including ***.  No history of colonoscopy or endoscopy in our system.  Today she states   Past Medical History:  Diagnosis Date  . Headache   . High cholesterol     Past Surgical History:  Procedure Laterality Date  . TUMOR REMOVAL Right   . VAGINAL HYSTERECTOMY      Current Outpatient Medications  Medication Sig Dispense Refill  . cetirizine (ZYRTEC) 10 MG tablet Take 1 tablet (10 mg total) by mouth daily. 60 tablet 5  . famotidine (PEPCID) 20 MG tablet Take 1 tablet (20 mg total) by mouth 2 (two) times daily. 60 tablet 5  . pantoprazole (PROTONIX) 40 MG tablet Take 1 tablet (40 mg total) by mouth daily for 14 days. 14 tablet 0  . predniSONE (DELTASONE) 10 MG tablet Take 3 tabs (30mg ) twice daily for 3 days, then 2 tabs (20mg ) twice daily for 3 days, then 1 tab (10mg ) twice daily for 3 days, then STOP. 36 tablet 0   No current facility-administered medications for this visit.     Allergies as of 02/24/2019  . (No Known Allergies)    Family History  Problem Relation Age of Onset  . Hypertension Maternal Grandmother   . Dementia Neg Hx     Social History   Socioeconomic History  . Marital status: Single    Spouse name: Not on file  . Number of children: 6  . Years of education: 6  . Highest education level: Not on file  Occupational History  . Not on file  Social Needs  . Financial resource strain: Not on file  . Food insecurity    Worry: Not on file    Inability: Not on file  . Transportation needs    Medical: Not on file    Non-medical: Not on file  Tobacco Use  . Smoking status: Former Smoker    Packs/day: 0.25    Years: 1.00    Pack years: 0.25    Quit date: 10/18/2015    Years  since quitting: 3.3  . Smokeless tobacco: Never Used  Substance and Sexual Activity  . Alcohol use: No    Alcohol/week: 0.0 standard drinks  . Drug use: No  . Sexual activity: Not Currently  Lifestyle  . Physical activity    Days per week: Not on file    Minutes per session: Not on file  . Stress: Not on file  Relationships  . Social Herbalist on phone: Not on file    Gets together: Not on file    Attends religious service: Not on file    Active member of club or organization: Not on file    Attends meetings of clubs or organizations: Not on file    Relationship status: Not on file  . Intimate partner violence    Fear of current or ex partner: Not on file    Emotionally abused: Not on file    Physically abused: Not on file    Forced sexual activity: Not on file  Other Topics Concern  . Not on file  Social History Narrative   Lives in apartments with helper.    Caffeine use: soda: rare   Tea: 1 per  day    Review of Systems: General: Negative for anorexia, weight loss, fever, chills, fatigue, weakness. Eyes: Negative for vision changes.  ENT: Negative for hoarseness, difficulty swallowing , nasal congestion. CV: Negative for chest pain, angina, palpitations, dyspnea on exertion, peripheral edema.  Respiratory: Negative for dyspnea at rest, dyspnea on exertion, cough, sputum, wheezing.  GI: See history of present illness. GU:  Negative for dysuria, hematuria, urinary incontinence, urinary frequency, nocturnal urination.  MS: Negative for joint pain, low back pain.  Derm: Negative for rash or itching.  Neuro: Negative for weakness, abnormal sensation, seizure, frequent headaches, memory loss, confusion.  Psych: Negative for anxiety, depression, suicidal ideation, hallucinations.  Endo: Negative for unusual weight change.  Heme: Negative for bruising or bleeding. Allergy: Negative for rash or hives.    Physical Exam: There were no vitals taken for this visit.  General:   Alert and oriented. Pleasant and cooperative. Well-nourished and well-developed.  Head:  Normocephalic and atraumatic. Eyes:  Without icterus, sclera clear and conjunctiva pink.  Ears:  Normal auditory acuity. Mouth:  No deformity or lesions, oral mucosa pink.  Throat/Neck:  Supple, without mass or thyromegaly. Cardiovascular:  S1, S2 present without murmurs appreciated. Normal pulses noted. Extremities without clubbing or edema. Respiratory:  Clear to auscultation bilaterally. No wheezes, rales, or rhonchi. No distress.  Gastrointestinal:  +BS, soft, non-tender and non-distended. No HSM noted. No guarding or rebound. No masses appreciated.  Rectal:  Deferred  Musculoskalatal:  Symmetrical without gross deformities. Normal posture. Skin:  Intact without significant lesions or rashes. Neurologic:  Alert and oriented x4;  grossly normal neurologically. Psych:  Alert and cooperative. Normal mood and affect. Heme/Lymph/Immune: No significant cervical adenopathy. No excessive bruising noted.    02/24/2019 7:53 AM   Disclaimer: This note was dictated with voice recognition software. Similar sounding words can inadvertently be transcribed and may not be corrected upon review.

## 2019-11-13 DIAGNOSIS — Z20828 Contact with and (suspected) exposure to other viral communicable diseases: Secondary | ICD-10-CM | POA: Diagnosis not present

## 2021-07-13 DIAGNOSIS — R5383 Other fatigue: Secondary | ICD-10-CM | POA: Diagnosis not present

## 2021-07-13 DIAGNOSIS — Z6830 Body mass index (BMI) 30.0-30.9, adult: Secondary | ICD-10-CM | POA: Diagnosis not present

## 2021-07-13 DIAGNOSIS — I10 Essential (primary) hypertension: Secondary | ICD-10-CM | POA: Diagnosis not present

## 2021-07-13 DIAGNOSIS — R42 Dizziness and giddiness: Secondary | ICD-10-CM | POA: Diagnosis not present

## 2021-10-05 ENCOUNTER — Encounter: Payer: Self-pay | Admitting: Neurology

## 2021-10-05 ENCOUNTER — Ambulatory Visit (INDEPENDENT_AMBULATORY_CARE_PROVIDER_SITE_OTHER): Payer: Medicare Other | Admitting: Neurology

## 2021-10-05 VITALS — BP 140/81 | HR 77 | Ht 61.0 in | Wt 162.0 lb

## 2021-10-05 DIAGNOSIS — R42 Dizziness and giddiness: Secondary | ICD-10-CM | POA: Diagnosis not present

## 2021-10-05 NOTE — Progress Notes (Signed)
Orthostatics   BP supine (x 5 minutes) 129/72  HR supine (x 5 minutes) 68  BP sitting 153/79  HR sitting 70  BP standing (after 1 minute) 157/78  HR standing (after 1 minute) 76  BP standing (after 3 minutes) 140/81  HR standing (after 3 minutes) 77  Orthostatics Comment asymptomatic throughout test

## 2021-10-05 NOTE — Progress Notes (Signed)
[facility]    Provider:  Dr Jaynee Eagles Referring Provider: Dettinger, Fransisca Kaufmann, MD   CC:  dizziness  History of present illness October 05, 2021: This is a 61 year old patient I have seen in the past for multiple neurologic and somatic complaints including concomitant depression and anxiety untreated, inconsistent complaints and clinical findings.  I reviewed Dr. Merilyn Baba notes: Patient presented with dizziness, acute and intermittent tends to be intermittent, she asked to see me again, last visit November 2022 and she stated the symptoms started 4 weeks prior.  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  We tried her on Cymbalta in the past for her headaches she never took it.  She also no showed an appointment in April 2019 with me.  She had a functional exam in the past and again multiple somatic and neurologic complaints likely from untreated mood disorder or personality disorder, unclear.  She has had dizziness. Here with an interpreter. She had dizziness a month ago and nothing since. She was outside, just standing, she has a grandson with a tumor in his head and she was stressed, she was standing, seconds, not the room spinning, felt a little off balance, very brief, did not fall, no focal sudden weakness, felt like heart was racing, not lightheaded, not pale, no vision changes, no hearng changes, completely resolved, she sat down and felt better and then was able to continue fine the rest of the day, she hadn't eaten or drank anything. She saw her primary care and they examined her heart.   Reviewed notes, labs and imaging from outside physicians, which showed:  09/30/2018: sed rate and esr normal  Personally reviewed imaging and agree  MRI brain 2017: IMPRESSION:  This MRI of the brain with and without contrast shows the  following: 1.    Prior right skull base and orbital surgery detailed above.     There continues to be thickening of the orbits medially displacing the right lateral rectus.   The findings are unchanged when compared to the MRI dated 01/21/2016. 2.    Encephalomalacia is noted in the adjacent brain. 3.    The enhancement pattern is normal.   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   MRI cervical spine 2017: IMPRESSION:  This MRI of the cervical spine shows the following: 1.   Small disc bulge at C5-C6 that does not lead to any nerve root impingement. 2.   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 The narrowing is not enough to be considered spinal stenosis. 3.   The spinal cord appears normal before and after contrast administration.    Interval history 08/07/2016: 61 year old female here for evaluation of memory changes, increased difficulty walking, multiple somatic and other neurologic complaints. She also has depression and Anxiety, untreated. Some inconsistent complaints and clinical findings.  They are back for follow-up. At last appointment I started Cymbalta for her headaches as well as her mood and multiple other complaints however son says that he forgot to pick it up and she never started it. He says that she is doing better. 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 At this point if she  feels improved I will discontinue the Cymbalta and she can follow up with her primary care and see [place] back in one year.   Interval history 05/08/2016: She never had the MRI of the brain w/wo contrast or the CT of the orbits completed, multiple messages were left, son says he received the messages but had too much going on in his life. Walking is not  better. She can hardly walk. The walking is still difficult but not worsening. She is here with an interpreter. She has been getting physical therapy. Everything feels numb, her arms, she has a lot of neck pain especially if she turns her head. Son says he had too much on his mind and he never got back to [place] for the imaging, he doesn't really know why. She can't walk. All of a sudden she is not moving, walking is worse, she is using a walker, son is trying to get her a nurse. Will orde rimaging of the brain and neck again and bring son and patient to our scheduler today to get it all set today. She reports weakness, headache, the right arm just drops. The right leg drops as well. The left side is easier to move. Lower abdomen feels numb. She has back pain. She cries in the office today. She is having urinary incontinence. She has headaches. She has only had a headache 2x since being here last. Pounding, on the right side and radiates to the back. She has a lot of back pain and neck pain and numbness and tingling.    MRI of the brain 01/21/2016: IMPRESSION: Postsurgical changes of the greater wing of the right sphenoid, lateral wall of the right orbit and right temporal bone. 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 Currently, bony expansion of the greater wing of the right sphenoid and lateral wall of right orbit contributes to inward displacement of the right lateral rectus muscle and narrowing of the right orbital apex. This may compromise the right optic nerve. Patient would benefit from dedicated orbital CT in addition to obtaining any prior exams for comparison.   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   No acute infarct.   Opacification  right sphenoid sinus.       Interval history 01/31/16 [facility]): Jamie Obrien is a 61 year old female with a history of memory disturbance. She returns today for evaluation. 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 At the last visit with Dr. Jaynee Eagles she ordered an MRI of the brain the patient had this done last month.she is also been to the [facility] emergency room twice- for chest pain, headache and paresthesia. She reports that her memory has remained the same. She operates a Teacher, music. Denies getting lost going to familiar places. She states that she is able to complete all ADLs independently. However she does feel that it is a "struggle at times." She states that she has a hard time grasping objects. She reports that this started in April. She also developed tremors. She states that on April 24 she had dizziness and was treated for vertigo. Also since the last visit she has been treated for chest pain. She states that the chest pain  has continued and radiates to the back. She was seen at [facility] for this discomfort. Several tests was completed however the results are not available to me. The patient also reports stiffness in the arms and across the neck. She states that the left leg feels as if it can give out at times. She also complains of burning and tingling in the hands and feet. She denies being diabetic.she also reports left facial numbness. She also saw her primary care recently for these same symptoms. He reordered an MRI that she completed in May.   HPI 10/25/2015:  Jamie Obrien is a 61 y.o. female here as a referral from Dr. Luciana Axe for memory problems. PMHx depression, anxiety, HLD.  Son provides information. Interpreter here as well. They started in 2012. 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 The surgery was in [place]. She does not have any information such as what kind of tumor. She was  eaving headaches all her life and they discovered the tumor in 2004 and it was removed in 2006 and then again in 2011. She has pain around the right eye and right side which has been going on for years.She feels the memory changes started after surgery. Not progressive. For example, she forgot where she parked her car in [place] and she started crying but she calmed down and eventually found it. She can't even remember what her date of birth is sometimes and remembering names. She loses things. Not getting worse. Memory problems are not getting worse. She has anxiety. She has depression and cries a lot. Son says she does have depression. She feels like she has been depressed since the surgery. It has been a tough since the surgery and then no offer for follow up or psychology and that was never addressed.  She has decreased energy. She has fatigue. She snores excessively and she wakes up and snoring or feeling she can't breath does wake her often. She has morning headaches. Feels like she never rests well. She recently had a sleep study and awaiting results. No history of Alzheimer's or dementia  in the family.   Reviewed notes, labs and imaging from outside physicians, which showed: HgbA1c 5.7, CMP, CBC unremarkable. TSH nml.    IMPRESSION: Postsurgical changes of the greater wing of the right sphenoid, lateral wall of the right orbit and right temporal bone. 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 Currently, bony expansion of the greater wing of the right sphenoid and lateral wall of right orbit contributes to inward displacement of the right lateral rectus muscle and narrowing of the right orbital apex. This may compromise the right optic nerve. Patient would benefit from dedicated orbital CT in addition to obtaining any prior exams for comparison.   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   If the patient had surgery for tumor  other than fibrous dysplasia then contrast enhanced MR may be considered to determine if there is epidural extension of this process after CT has been obtained.   No acute infarct.   Opacification right sphenoid sinus.   Review of Systems: Patient complains of symptoms per HPI as well as the following symptoms: diziness . Pertinent negatives and positives per HPI. All others negative    Social History   Socioeconomic History   Marital status: Single    Spouse name: Not on file   Number of children: 6   Years of  education: 6   Highest education level: Not on file  Occupational History   Not on file  Tobacco Use   Smoking status: Former    Packs/day: 0.25    Years: 1.00    Pack years: 0.25    Types: Cigarettes    Quit date: 21    Years since quitting: 30.1   Smokeless tobacco: Never  Vaping Use   Vaping Use: Never used  Substance and Sexual Activity   Alcohol use: No    Alcohol/week: 0.0 standard drinks   Drug use: No   Sexual activity: Not Currently  Other Topics Concern   Not on file  Social History Narrative   Lives with brother   Caffeine use: soda: sometimes    Tea: 1 per day   Right handed   Social Determinants of Health   Financial Resource Strain: Not on file  Food Insecurity: Not on file  Transportation Needs: Not on file  Physical Activity: Not on file  Stress: Not on file  Social Connections: Not on file  Intimate Partner Violence: Not on file    Family History  Problem Relation Age of Onset   Hypertension Maternal Grandmother    Dementia Neg Hx     Past Medical History:  Diagnosis Date   Dizziness    Dust allergy    Headache    High cholesterol    Pollen allergy     Past Surgical History:  Procedure Laterality Date   TUMOR REMOVAL Right    VAGINAL HYSTERECTOMY      No current outpatient medications on file.   No current facility-administered medications for this visit.    Allergies as of 10/05/2021 - Review Complete  10/05/2021  Allergen Reaction Noted   Pollen extract  10/05/2021    Vitals: BP 140/81 (BP Location: Left Arm, Patient Position: Standing)    Pulse 77    Ht 5' 1"  (1.549 m)    Wt 162 lb (73.5 kg)    BMI 30.61 kg/m  Last Weight:  Wt Readings from Last 1 Encounters:  10/05/21 162 lb (73.5 kg)   Last Height:   Ht Readings from Last 1 Encounters:  10/05/21 5' 1"  (1.549 m)   Physical exam: Exam: Gen: NAD, conversant, well nourised, obese, well groomed                     CV: RRR, no MRG. No Carotid Bruits. No peripheral edema, warm, nontender Eyes: Conjunctivae clear without exudates or hemorrhage  Neuro: Detailed Neurologic Exam  Speech:    Speech is normal; fluent and spontaneous with normal comprehension.  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 The left pupil is round, and reactive to light.Left eye visual fields are full to finger confrontation. Extraocular movements are intact with,mild right impaired abduction. Trigeminal sensation equal to LT bilateraly. Right ptosis. . The palate elevates in the midline. Hearing intact. Voice is normal. Shoulder shrug is normal. The tongue has normal motion without fasciculations.    Coordination: Intact FTN and HTS    Tone:   normal   Posture:normal     Strength: 5/5     Sensation: intact light touch bilaterally    Reflex Exam:   DTR's:  Deep tendon reflexes in the upper and lower extremities are slightly brisk bilaterally.   Toes:    The toes are equiv  bilaterally.   Clonus:    few beats could be normal   Gait: can get up independently, erect posture, can walk on heels and toes, slightly imbalance with tandem    Assessment/Plan: This is a 61 year old patient I have seen in the past for multiple neurologic and somatic complaints  including concomitant depression and anxiety untreated, inconsistent complaints and prior functional exam.  Today she is here for an episode of dizziness, she has not had any dizziness for over a month, that day she was stressed and had not drank or eaten anything, her exam today shows normal gait, strength, sensation and she is asymptomatic.   - Dizziness, not vertigo, sounds more orthostatic or vasovagal, had an episode when she was not drinking fluid during the day and was stressed, I do not see any concerning symptoms, she has been asymptomatic for over a month, encouraged hydration, exam today was [facility] improved from the last time I saw her (last exam was not physiologically consistent and likely functional).  Given her exam today and not having any more symptoms, asymptomatic, has not had dizziness for over a month, appears to be a provoked episode, I do not think we need any imaging or further work-up.  She can come back as needed.  Reassured patient.  -Not orthostatic today however her blood pressure did drop from systolic 1 02-1 40 while standing for 3 minutes, I encouraged hydration and follow-up with primary care.   Orthostatics BP supine (x 5 minutes) 129/72  HR supine (x 5 minutes) 68  BP sitting 153/79  HR sitting 70  BP standing (after 1 minute) 157/78  HR standing (after 1 minute) 76  BP standing (after 3 minutes) 140/81  HR standing (after 3 minutes) 77      Prior assessment and plan last appointment [id number]:  61 year old female here for evaluation of memory changes, increased difficulty walking, multiple somatic and other neurologic complaints. She also has depression and Anxiety, untreated. Some inconsistent complaints and clinical findings.    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 Patient reports headaches not increased, only 2 in the last 3 months.  - Depression and Anxiety, untreated: Needs follow up with primary care.  -- Memory loss: Unlikely dementia at this age, also memory changes are not progressive. May be sequelae from intracranial surgery or depression. B12, HIV, RPR were normal - Depression and anxiety may be a factor in her multiple somatic and neurologic complaints. . F/u with pcp.  - Follow up 6-14month     ASarina Ill MD   [facility] [address] [address] StreetSSalton [place] BeachGDaly [place] [address]  Phone [phone] [phone] I spent 60 minutes of face-to-face and non-face-to-face time with patient on the  1. Dizziness    diagnosis.  This included previsit chart review, lab review, study review, order entry, electronic health record documentation, patient education on the different diagnostic and therapeutic options, counseling and coordination of care, risks and benefits of management, compliance, or risk factor reduction

## 2021-10-05 NOTE — Patient Instructions (Signed)
Mareos Dizziness Los mareos son un problema muy frecuente. Se trata de una sensacin de inestabilidad o desvanecimiento. Puede sentir que se va a desmayar. Los [employer] pueden provocarle una lesin si se tropieza o se cae. Las Engineer, manufacturing de todas las edades pueden sufrir Tree surgeon, Armed forces training and education officer es ms frecuente en los adultos [place]. Esta afeccin puede tener muchas causas, entre las que se pueden [employer], la deshidratacin y [place]. Siga estas instrucciones en su casa: Comida y bebida  Beba suficiente lquido como para Theatre manager la orina de color amarillo plido. Esto evita la deshidratacin. Trate de beber ms lquidos transparentes, como agua. No beba alcohol. Limite el consumo de cafena si el mdico se lo indica. Verifique los ingredientes y la informacin nutricional para saber si un alimento o una bebida contienen cafena. Limite el consumo de sal (sodio) si el mdico se lo indica. Verifique los ingredientes y la informacin nutricional para saber si un alimento o una bebida contienen sodio. Actividad  Evite los movimientos rpidos. Levntese de las sillas con lentitud y apyese hasta sentirse bien. Por la maana, sintese primero a un lado de la cama. Cuando se sienta bien, pngase lentamente de pie mientras se sostiene de algo, hasta que sepa que ha logrado el equilibrio. Mueva las piernas con frecuencia si debe estar de pie en un lugar durante mucho tiempo. Mientras est de pie, contraiga y relaje los msculos de las piernas. No conduzca vehculos ni opere maquinaria si se siente mareado. Evite agacharse si se siente mareado. En su casa, coloque los objetos de modo que le resulte fcil alcanzarlos sin Office manager. Estilo de vida No consuma ningn producto que contenga nicotina o tabaco. Estos productos incluyen cigarrillos, tabaco para Higher education careers adviser y aparatos de vapeo, como los Psychologist, sport and exercise. Si necesita ayuda para dejar de fumar, consulte al [employer]. Trate de reducir  el nivel de estrs con mtodos como el yoga o la meditacin. Hable con el mdico si necesita ayuda para controlar el nivel de estrs. Instrucciones generales Controle sus mareos para ver si hay cambios. Use los medicamentos de venta libre y los recetados solamente como se lo haya indicado el mdico. Hable con el mdico si cree que los medicamentos que est tomando son la causa de sus mareos. Infrmele a un amigo o a un familiar si se siente mareado. Pdale a esta persona que llame al mdico si observa cambios en su comportamiento. Concurra a [place]. Esto es importante. Comunquese con un mdico si: Los mareos no desaparecen o tiene sntomas nuevos. Los [employer] o la sensacin de Engineer, petroleum. Siente nuseas. Se le redujo la audicin. Tiene fiebre. Dolor o rigidez en el cuello. Los [employer] derivan en una lesin o una cada. Solicite ayuda de inmediato si: Vomita o tiene diarrea y no puede comer ni beber nada. Tiene dificultad para hablar, caminar, tragar o [employer], las [place]. Se siente constantemente dbil. Tiene cualquier tipo de sangrado. No piensa con claridad o tiene dificultad para armar oraciones. Es posible que un amigo o un familiar adviertan que esto ocurre. Tiene dolor de pecho, dolor abdominal, sudoracin o Risk manager. Tiene cambios en la visin o le aparece un dolor de cabeza intenso. Estos sntomas pueden representar un problema grave que constituye Engineer, maintenance (IT). No espere a ver si los sntomas desaparecen. Solicite atencin mdica de inmediato. Comunquese con el servicio de emergencias de su localidad (911 en los [place]). No conduzca por sus propios medios [employer]  hospital. [facility] son Ardelia Mems sensacin de inestabilidad o desvanecimiento. Esta afeccin puede tener muchas causas, entre las que se pueden [employer], la deshidratacin y [place]. Las Engineer, manufacturing de todas las  edades pueden sufrir Tree surgeon, Armed forces training and education officer es ms frecuente en los adultos [place]. Beba suficiente lquido como para Theatre manager la orina de color amarillo plido. No beba alcohol. Evite los movimientos rpidos si se siente mareado. Controle sus mareos para ver si hay cambios. Esta informacin no tiene Marine scientist el consejo del mdico. Asegrese de hacerle al mdico cualquier pregunta que tenga. Document Revised: 08/09/2020 Document Reviewed: 08/09/2020 Elsevier Patient Education  2022 [employer].

## 2021-10-06 ENCOUNTER — Encounter: Payer: Self-pay | Admitting: Neurology

## 2021-10-06 DIAGNOSIS — R42 Dizziness and giddiness: Secondary | ICD-10-CM | POA: Insufficient documentation

## 2022-06-05 DIAGNOSIS — Z1212 Encounter for screening for malignant neoplasm of rectum: Secondary | ICD-10-CM | POA: Diagnosis not present

## 2022-06-05 DIAGNOSIS — Z1211 Encounter for screening for malignant neoplasm of colon: Secondary | ICD-10-CM | POA: Diagnosis not present
